# Patient Record
Sex: Male | Born: 1955 | Race: Black or African American | Hispanic: No | Smoking: Current every day smoker
Health system: Southern US, Community
[De-identification: ages and names within clinical notes are randomized; demographics above are authoritative.]

## PROBLEM LIST (undated history)

## (undated) DIAGNOSIS — I639 Cerebral infarction, unspecified: Secondary | ICD-10-CM

## (undated) DIAGNOSIS — M5416 Radiculopathy, lumbar region: Secondary | ICD-10-CM

## (undated) DIAGNOSIS — G8929 Other chronic pain: Secondary | ICD-10-CM

## (undated) DIAGNOSIS — M549 Dorsalgia, unspecified: Secondary | ICD-10-CM

## (undated) DIAGNOSIS — I10 Essential (primary) hypertension: Secondary | ICD-10-CM

## (undated) HISTORY — PX: HERNIA REPAIR: SHX51

---

## 1993-12-08 HISTORY — PX: HERNIA REPAIR: SHX51

## 2002-02-18 ENCOUNTER — Ambulatory Visit (HOSPITAL_COMMUNITY): Admission: RE | Admit: 2002-02-18 | Discharge: 2002-02-18 | Payer: Self-pay | Admitting: Family Medicine

## 2002-02-18 ENCOUNTER — Encounter: Payer: Self-pay | Admitting: Family Medicine

## 2002-02-23 ENCOUNTER — Ambulatory Visit (HOSPITAL_COMMUNITY): Admission: RE | Admit: 2002-02-23 | Discharge: 2002-02-23 | Payer: Self-pay | Admitting: Family Medicine

## 2002-05-13 ENCOUNTER — Emergency Department (HOSPITAL_COMMUNITY): Admission: EM | Admit: 2002-05-13 | Discharge: 2002-05-13 | Payer: Self-pay | Admitting: Emergency Medicine

## 2006-01-22 ENCOUNTER — Emergency Department (HOSPITAL_COMMUNITY): Admission: EM | Admit: 2006-01-22 | Discharge: 2006-01-22 | Payer: Self-pay | Admitting: Emergency Medicine

## 2006-03-11 ENCOUNTER — Ambulatory Visit (HOSPITAL_COMMUNITY): Admission: RE | Admit: 2006-03-11 | Discharge: 2006-03-11 | Payer: Self-pay | Admitting: Family Medicine

## 2008-02-08 ENCOUNTER — Emergency Department (HOSPITAL_COMMUNITY): Admission: EM | Admit: 2008-02-08 | Discharge: 2008-02-08 | Payer: Self-pay | Admitting: Emergency Medicine

## 2010-01-08 ENCOUNTER — Emergency Department (HOSPITAL_COMMUNITY): Admission: EM | Admit: 2010-01-08 | Discharge: 2010-01-08 | Payer: Self-pay | Admitting: Emergency Medicine

## 2010-01-27 ENCOUNTER — Emergency Department (HOSPITAL_COMMUNITY): Admission: EM | Admit: 2010-01-27 | Discharge: 2010-01-27 | Payer: Self-pay | Admitting: Emergency Medicine

## 2010-06-11 ENCOUNTER — Ambulatory Visit (HOSPITAL_COMMUNITY): Admission: RE | Admit: 2010-06-11 | Discharge: 2010-06-11 | Payer: Self-pay | Admitting: Family Medicine

## 2011-02-08 ENCOUNTER — Emergency Department (HOSPITAL_COMMUNITY)
Admission: EM | Admit: 2011-02-08 | Discharge: 2011-02-08 | Disposition: A | Discharge status: Home / Self Care | Payer: BC Managed Care – PPO | Attending: Emergency Medicine | Admitting: Emergency Medicine

## 2011-02-08 ENCOUNTER — Emergency Department (HOSPITAL_COMMUNITY): Payer: BC Managed Care – PPO

## 2011-02-08 ENCOUNTER — Encounter: Payer: Self-pay | Admitting: Orthopedic Surgery

## 2011-02-08 DIAGNOSIS — Y929 Unspecified place or not applicable: Secondary | ICD-10-CM | POA: Insufficient documentation

## 2011-02-08 DIAGNOSIS — W2209XA Striking against other stationary object, initial encounter: Secondary | ICD-10-CM | POA: Insufficient documentation

## 2011-02-08 DIAGNOSIS — S62319A Displaced fracture of base of unspecified metacarpal bone, initial encounter for closed fracture: Secondary | ICD-10-CM | POA: Insufficient documentation

## 2011-02-10 ENCOUNTER — Ambulatory Visit (INDEPENDENT_AMBULATORY_CARE_PROVIDER_SITE_OTHER): Payer: BC Managed Care – PPO | Admitting: Orthopedic Surgery

## 2011-02-10 ENCOUNTER — Encounter: Payer: Self-pay | Admitting: Orthopedic Surgery

## 2011-02-10 DIAGNOSIS — S62319A Displaced fracture of base of unspecified metacarpal bone, initial encounter for closed fracture: Secondary | ICD-10-CM | POA: Insufficient documentation

## 2011-02-17 ENCOUNTER — Encounter: Payer: Self-pay | Admitting: Orthopedic Surgery

## 2011-02-18 NOTE — Letter (Signed)
Summary: Out of Work  [facility]  [address]  [place], [place] [address]   Phone: [phone]  Fax: [phone]    February 10, 2011   Employee:  Randy Ashley    To Whom It May Concern:   For Medical reasons, please excuse the above named employee from work for the following dates:  Start:   02/10/11 - Seen in our office for Appointment today.             Out of work X6 weeks  End:   03/25/11 (Approximate return to work)  If you need additional information, please feel free to contact our office.         Sincerely,    Terrance Mass, MD

## 2011-02-18 NOTE — Assessment & Plan Note (Signed)
Summary: AP ER FOL/UP FX LT HAND/XRAY AP 06/06/11/BCBS/[facility]F   Visit Type:  new patient Referring Provider:  ap er Primary Provider:  Dr. Sudie Bailey  CC:  left hand.  History of Present Illness:   55 years old male in no his LEFT dominant hand.  Date of injury March 1  Complaints of mild pain and discomfort swelling worsened by movement relieved by rest and mobility   DOI 02/06/11 hit a wall.  Xrays [facility] 02/08/11 left hand [facility].  Norco 5 number 20 and Ibuporfen 800mg  number 30 given from er.  he has not required any medication at this point.  Allergies (verified): No Known Drug Allergies  Past History:  Past Medical History: na  Past Surgical History: na  Family History: Family History of Diabetes Family History Coronary Heart Disease male < 23 Family History of Arthritis  Social History: Patient is single.  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 Cardiovascular:  Denies chest pain, palpitations, fainting, and murmurs. Respiratory:  Denies short of breath, wheezing, couch, tightness, pain on inspiration, and snoring . Gastrointestinal:  Denies heartburn, nausea, vomiting, diarrhea, constipation, and blood in your stools. Genitourinary:  Denies frequency, urgency, difficulty urinating, painful urination, flank pain, and bleeding in urine. Neurologic:  Denies numbness, tingling, unsteady gait, dizziness, tremors, and seizure. Musculoskeletal:  Denies joint pain, swelling, instability, stiffness, redness, heat, and muscle pain. Endocrine:  Denies excessive thirst, exessive urination, and heat or cold intolerance. Psychiatric:  Denies nervousness, depression, anxiety, and hallucinations. Skin:  Denies changes in the skin, poor healing, rash, itching, and redness. HEENT:  Denies blurred or double vision, eye pain,  redness, and watering. Immunology:  Denies seasonal allergies, sinus problems, and allergic to bee stings. Hemoatologic:  Denies easy bleeding and brusing.  Physical Exam  Cervical Nodes:  no significant adenopathy Psych:  alert and cooperative; normal mood and affect; normal attention span and concentration   Wrist/Hand Exam  General:    Well-developed, well-nourished, in no acute distress; alert and oriented x 3.    Skin:    Intact with no erythema; no scarring.    Inspection:    swelling: LEFT hand  Palpation:    tenderness at the proximal portion of the fifth metacarpal at the carpometacarpal joint LEFT hand.  Vascular:    normal pulse perfusion and capillary refill in the LEFT upper extremity  Sensory:    normal sensation LEFT upper extremity  Motor:    normal muscle tone LEFT upper extremity  Reflexes:    reflexes were deferred  Wrist Exam:    Left:    Inspection:  Normal    Palpation:  Normal    Stability:  stable  Hand Exam:    Left:    Inspection:  Abnormal    Palpation:  Abnormal    Tenderness:  5th metacarpal    Swelling:  5th metacarpal    Erythema:  no    rotational alignment was checked with the full passive extension wrist test and compared to his RIGHT hand and there is no rotatory malalignment no angular malalignment   Impression & Recommendations:  Problem # 1:  CLOSED FRACTURE OF BASE OF OTHER METACARPAL BONE (ICD-815.02) Assessment New  hospital x-rays were reviewed with the report.  There is a basilar fracture fifth metacarpal LEFT hand is minimal to no dorsal angulation.  It appears to be a Y-shaped fracture with some displacement.  I discussed this with the patient offered and cast treatment vs. surgical treatment with a plate.  He opted for the cast treatment.  He was placed in a short arm cast  X-ray in 3 weeks in cast  Total time expected 6 weeks.  Orders: New Patient Level III ([address]) Metacarpal Fx ([address])  Medications  Added to Medication List This Visit: 1)  Norco 5-325 Mg Tabs (Hydrocodone-acetaminophen) .Marland Kitchen.. 1 by mouth q 4 prn pain  Patient Instructions: 1)  CAST X 6 WEEKS  2)  XRAYS IN 3 WEEKS LEFT HAND / IN CAST  3)  LEFT HAND WEIGHT LIMIT 5 LBS OR LESS  4)  Please do not get the cast wet. It will casue a severe skin reaction. If you do get it wet, dry it with a hairdryer on a low setting and call the office. [the cast will need to be changed] Prescriptions: NORCO 5-325 MG TABS (HYDROCODONE-ACETAMINOPHEN) 1 by mouth q 4 prn pain  #42 x 1   Entered and Authorized by:   Fuller Canada MD   Signed by:   Fuller Canada MD on 02/10/2011   Method used:   Print then Give to Patient   RxID:   [id number]    Orders Added: 1)  New Patient Level III [[address]] 2)  Metacarpal Fx [[address]600]

## 2011-02-26 ENCOUNTER — Encounter: Payer: Self-pay | Admitting: Orthopedic Surgery

## 2011-03-05 ENCOUNTER — Ambulatory Visit (INDEPENDENT_AMBULATORY_CARE_PROVIDER_SITE_OTHER): Payer: BC Managed Care – PPO | Admitting: Orthopedic Surgery

## 2011-03-05 DIAGNOSIS — S62308A Unspecified fracture of other metacarpal bone, initial encounter for closed fracture: Secondary | ICD-10-CM

## 2011-03-05 DIAGNOSIS — S62309A Unspecified fracture of unspecified metacarpal bone, initial encounter for closed fracture: Secondary | ICD-10-CM

## 2011-03-05 NOTE — Discharge Summary (Signed)
Separate identifiable. X-ray report, LEFT hand. X-rays 3 views, AP, lateral, and oblique.  Standard x-rays.  Followup of 5th metacarpal fracture.  X-rays show 1/5 metacarpal fracture at the base, which is reduced in good position with appropriate alignment.  Impression healing reduced fracture, LEFT 5th metacarpal.

## 2011-03-05 NOTE — Progress Notes (Signed)
Date of injury March 1.  Short arm cast was applied.  Diagnosis basilar fracture, 5th metacarpal.  Fracture was comminuted in 3 pieces in Y. Configuration. Minimal dorsal displacement.   patient was placed in a short arm cast. Patient wants cast removed. I. Time, the cast can be removed, but it can be changed. Cast was changed.  X-ray was taken.  X-rays show fractures in a reduced position and healing well. Appropriate alignment.

## 2011-03-06 NOTE — Letter (Signed)
Summary: History form  History form   Imported By: Cammie Sickle 02/24/2011 22:03:56  _____________________________________________________________________  External Attachment:    Type:   Image     Comment:   External Document

## 2011-03-27 ENCOUNTER — Ambulatory Visit (INDEPENDENT_AMBULATORY_CARE_PROVIDER_SITE_OTHER): Payer: BC Managed Care – PPO | Admitting: Orthopedic Surgery

## 2011-03-27 ENCOUNTER — Encounter: Payer: Self-pay | Admitting: Orthopedic Surgery

## 2011-03-27 DIAGNOSIS — S62309A Unspecified fracture of unspecified metacarpal bone, initial encounter for closed fracture: Secondary | ICD-10-CM

## 2011-03-27 NOTE — Discharge Summary (Signed)
Separate x-ray report 3 views, LEFTPain  AP, lateral, and oblique fractures are seen at the base of the 5th metacarpal, which has healed in normal alignment.  Impression healed fracture, 5th metacarpal, LEFT hand

## 2011-03-27 NOTE — Progress Notes (Signed)
Fracture follow up   Injury date 3.1.2012  Dx left metacarpal base fracture   Treatment S[facility]   Today sched for x rays   Subjective: no complaints   Exam normal alignment   xrays shows fracture healing   D/C RTW

## 2011-04-01 ENCOUNTER — Ambulatory Visit: Payer: BC Managed Care – PPO | Admitting: Orthopedic Surgery

## 2011-04-25 NOTE — Procedures (Signed)
[facility]  Patient:    NGAI, PARCELL Visit Number: [id number] MRN: [id number]          Service Type: OUT Location: RAD Attending Physician:  John Giovanni Dictated by:   Kari Baars, M.D. Proc. Date: 02/23/02 Admit Date:  02/18/2002 Discharge Date: 02/18/2002                      Pulmonary Function Test Inter.  RESULTS: 1. Spirometry shows minimal ventilatory defect without definite air flow    obstruction. 2. Lung volumes show mild restrictive change and mild air trapping. 3. DLCO is normal. Dictated by:   Kari Baars, M.D. Attending Physician:  John Giovanni DD:  02/23/02 TD:  02/25/02 Job: [id number]

## 2011-09-01 LAB — POCT I-STAT, CHEM 8
Chloride: 107
Creatinine, Ser: 0.9
Glucose, Bld: 104 — ABNORMAL HIGH
HCT: 47
Hemoglobin: 16
Potassium: 3.7
Sodium: 143

## 2011-09-01 LAB — POCT CARDIAC MARKERS: CKMB, poc: 1.1

## 2011-10-07 ENCOUNTER — Encounter (HOSPITAL_COMMUNITY): Payer: Self-pay | Admitting: *Deleted

## 2011-10-07 ENCOUNTER — Emergency Department (HOSPITAL_COMMUNITY)
Admission: EM | Admit: 2011-10-07 | Discharge: 2011-10-07 | Disposition: A | Discharge status: Home / Self Care | Payer: BC Managed Care – PPO | Attending: Emergency Medicine | Admitting: Emergency Medicine

## 2011-10-07 DIAGNOSIS — F1010 Alcohol abuse, uncomplicated: Secondary | ICD-10-CM | POA: Insufficient documentation

## 2011-10-07 DIAGNOSIS — F411 Generalized anxiety disorder: Secondary | ICD-10-CM | POA: Insufficient documentation

## 2011-10-07 DIAGNOSIS — F17200 Nicotine dependence, unspecified, uncomplicated: Secondary | ICD-10-CM | POA: Insufficient documentation

## 2011-10-07 DIAGNOSIS — F419 Anxiety disorder, unspecified: Secondary | ICD-10-CM

## 2011-10-07 NOTE — ED Notes (Signed)
Discharge instructions reviewed with pt, questions answered. Pt verbalized understanding.  

## 2011-10-07 NOTE — ED Notes (Signed)
Felt nervous for 1 month.

## 2011-10-07 NOTE — ED Provider Notes (Signed)
History     CSN: [id number] Arrival date & time: 10/07/2011  3:41 AM   First MD Initiated Contact with Patient 10/07/11 [phone]      Chief Complaint  Patient presents with  . Anxiety     The history is provided by the patient.   patient reports for approximately one month he has been nervous and reports this as "nervousness on the inside".  He reports sometimes he can feel his own heartbeat in size.  He reports that the heart is not going faster than the heart rhythm is not irregular.  He denies chest pain shortness of breath.  He denies homicidal and suicidal ideation.  He does report heavy EtOH abuse and has for years.  He has no intention of wanting to quit drinking.  He does report this is likely a problem.  He also reports smoking 2 packs of cigarettes a day and also understands that this is unhealthy but he does not want to quit.  There is no prior mental health problems.  He reports a sense of uneasiness is affecting his sleep.  He has had no recent life changes.  He continues to hold a job and is having difficulty at work.  He's had no recent death in the family.  He reports finances are not a current stressor.  Nothing improves his symptoms.  Nothing worsens his symptoms.  He has otherwise no other associated symptoms.  His symptoms are described as mild in severity.  He has not doctors primary care doctor about the symptoms  History reviewed. No pertinent past medical history.  History reviewed. No pertinent past surgical history.  Family History  Problem Relation Age of Onset  . Diabetes      Family History   . Heart disease      Family History   . Arthritis      Family History     History  Substance Use Topics  . Smoking status: Current Everyday Smoker    Types: Cigarettes  . Smokeless tobacco: Not on file  . Alcohol Use: Yes     Drinks 6 pack of beer per day       Review of Systems  All other systems reviewed and are negative.    Allergies  Review of  patient's allergies indicates no known allergies.  Home Medications   Current Outpatient Rx  Name Route Sig Dispense Refill  . HYDROCODONE-ACETAMINOPHEN 5-325 MG PO TABS Oral Take 1 tablet by mouth every 4 (four) hours as needed.        BP 141/73  Pulse 77  Temp(Src) 98.6 F (37 C) (Oral)  Resp 18  Ht 5\' 7"  (1.702 m)  Wt 140 lb (63.504 kg)  BMI 21.93 kg/m2  SpO2 98%  Physical Exam  Constitutional: He is oriented to person, place, and time. He appears well-developed and well-nourished.  HENT:  Head: Normocephalic.  Eyes: EOM are normal.  Neck: Normal range of motion.  Cardiovascular: Normal rate and regular rhythm.   Pulmonary/Chest: Effort normal.  Abdominal: Soft.  Musculoskeletal: Normal range of motion.  Neurological: He is alert and oriented to person, place, and time.  Skin: Skin is warm and dry.  Psychiatric: He has a normal mood and affect.    ED Course  Procedures (including critical care time)   Date: 10/07/2011  Rate: 77  Rhythm: normal sinus rhythm  QRS Axis: normal  Intervals: normal  ST/T Wave abnormalities: normal  Conduction Disutrbances:none  Narrative Interpretation:   Old EKG  Reviewed: No significant changes noted     Labs Reviewed - No data to display No results found.   1. Anxiety       MDM  Patient is well-appearing.  He is does not appear to be a threat to himself or to others.  May represent underlying anxiety disorder for whichshe's been possibly self-medicating with alcohol.  At this time the patient does not want to stop drinking or smoking.  He's been encouraged to do both.  He is instructed to followup with his primary care physician in the next several days for additional evaluation.  Patient will likely benefit from outpatient psychiatric and psychological evaluation as well.         Lyanne Co, MD 10/07/11 [phone]

## 2015-09-11 ENCOUNTER — Emergency Department (HOSPITAL_COMMUNITY)
Admission: EM | Admit: 2015-09-11 | Discharge: 2015-09-11 | Disposition: A | Discharge status: Home / Self Care | Payer: BLUE CROSS/BLUE SHIELD | Attending: Emergency Medicine | Admitting: Emergency Medicine

## 2015-09-11 ENCOUNTER — Emergency Department (HOSPITAL_COMMUNITY): Payer: BLUE CROSS/BLUE SHIELD

## 2015-09-11 ENCOUNTER — Encounter (HOSPITAL_COMMUNITY): Payer: Self-pay | Admitting: Emergency Medicine

## 2015-09-11 DIAGNOSIS — G8929 Other chronic pain: Secondary | ICD-10-CM | POA: Insufficient documentation

## 2015-09-11 DIAGNOSIS — R202 Paresthesia of skin: Secondary | ICD-10-CM | POA: Diagnosis not present

## 2015-09-11 DIAGNOSIS — R1030 Lower abdominal pain, unspecified: Secondary | ICD-10-CM | POA: Diagnosis present

## 2015-09-11 DIAGNOSIS — I10 Essential (primary) hypertension: Secondary | ICD-10-CM | POA: Insufficient documentation

## 2015-09-11 DIAGNOSIS — Z720 Tobacco use: Secondary | ICD-10-CM | POA: Insufficient documentation

## 2015-09-11 DIAGNOSIS — Z8739 Personal history of other diseases of the musculoskeletal system and connective tissue: Secondary | ICD-10-CM | POA: Diagnosis not present

## 2015-09-11 HISTORY — DX: Other chronic pain: G89.29

## 2015-09-11 HISTORY — DX: Radiculopathy, lumbar region: M54.16

## 2015-09-11 HISTORY — DX: Dorsalgia, unspecified: M54.9

## 2015-09-11 HISTORY — DX: Essential (primary) hypertension: I10

## 2015-09-11 LAB — CBC WITH DIFFERENTIAL/PLATELET
BASOS ABS: 0 10*3/uL (ref 0.0–0.1)
BASOS PCT: 0 %
EOS PCT: 5 %
Eosinophils Absolute: 0.3 10*3/uL (ref 0.0–0.7)
HCT: 41.6 % (ref 39.0–52.0)
Hemoglobin: 14.1 g/dL (ref 13.0–17.0)
LYMPHS PCT: 39 %
Lymphs Abs: 2.2 10*3/uL (ref 0.7–4.0)
MCH: 32.4 pg (ref 26.0–34.0)
MCHC: 33.9 g/dL (ref 30.0–36.0)
MCV: 95.6 fL (ref 78.0–100.0)
Monocytes Absolute: 0.4 10*3/uL (ref 0.1–1.0)
Monocytes Relative: 8 %
Neutro Abs: 2.7 10*3/uL (ref 1.7–7.7)
Neutrophils Relative %: 48 %
PLATELETS: 266 10*3/uL (ref 150–400)
RBC: 4.35 MIL/uL (ref 4.22–5.81)
RDW: 13.7 % (ref 11.5–15.5)
WBC: 5.5 10*3/uL (ref 4.0–10.5)

## 2015-09-11 LAB — COMPREHENSIVE METABOLIC PANEL
ALBUMIN: 3.7 g/dL (ref 3.5–5.0)
ALT: 29 U/L (ref 17–63)
AST: 34 U/L (ref 15–41)
Alkaline Phosphatase: 57 U/L (ref 38–126)
Anion gap: 10 (ref 5–15)
BUN: 19 mg/dL (ref 6–20)
CHLORIDE: 108 mmol/L (ref 101–111)
CO2: 22 mmol/L (ref 22–32)
CREATININE: 0.81 mg/dL (ref 0.61–1.24)
Calcium: 8.4 mg/dL — ABNORMAL LOW (ref 8.9–10.3)
GFR calc Af Amer: >60 mL/min (ref >60)
GLUCOSE: 109 mg/dL — AB (ref 65–99)
POTASSIUM: 4.1 mmol/L (ref 3.5–5.1)
SODIUM: 140 mmol/L (ref 135–145)
Total Bilirubin: 0.2 mg/dL — ABNORMAL LOW (ref 0.3–1.2)
Total Protein: 6.9 g/dL (ref 6.5–8.1)

## 2015-09-11 LAB — URINALYSIS, ROUTINE W REFLEX MICROSCOPIC
Bilirubin Urine: NEGATIVE
Glucose, UA: NEGATIVE mg/dL
HGB URINE DIPSTICK: NEGATIVE
KETONES UR: NEGATIVE mg/dL
LEUKOCYTES UA: NEGATIVE
NITRITE: NEGATIVE
PROTEIN: NEGATIVE mg/dL
Specific Gravity, Urine: 1.025 (ref 1.005–1.030)
Urobilinogen, UA: 0.2 mg/dL (ref 0.0–1.0)
pH: 6 (ref 5.0–8.0)

## 2015-09-11 LAB — SEDIMENTATION RATE: Sed Rate: 2 mm/hr (ref 0–16)

## 2015-09-11 LAB — C-REACTIVE PROTEIN: CRP: <0.5 mg/dL (ref <1.0)

## 2015-09-11 LAB — CK: Total CK: 135 U/L (ref 49–397)

## 2015-09-11 MED ORDER — ALPRAZOLAM 0.5 MG PO TABS
0.5000 mg | ORAL_TABLET | Freq: Once | ORAL | Status: DC
  Filled 2015-09-11: qty 1

## 2015-09-11 NOTE — ED Notes (Signed)
Pt c/o of groin pain that radiates to LT/RT buttock. Pt describes it as a "burning sensation." Pt denies urinary symptoms. Denies n/v/d.

## 2015-09-11 NOTE — ED Provider Notes (Signed)
CSN: [id number]     Arrival date & time 09/11/15  1515 History   First MD Initiated Contact with Patient 09/11/15 1555     Chief Complaint  Patient presents with  . Groin Pain     (Consider location/radiation/quality/duration/timing/severity/associated sxs/prior Treatment) HPI  59 year old male without a significant past medical history presents to the emergency department today with 1 month of progressively worsening pain and perirectal burning and radiation down to the perineal region that also burning in nature. No weakness or sensation changes otherwise. No urinary symptoms, penile discharge, scrotal pain, diarrhea, constipation, nausea, vomiting, fever, chills or other related symptoms. Has not had this before. Feels like he may have lost some weight during this time but is unsure.  Past Medical History  Diagnosis Date  . Hypertension   . Chronic back pain   . Lumbar radiculopathy    History reviewed. No pertinent past surgical history. Family History  Problem Relation Age of Onset  . Diabetes      Family History   . Heart disease      Family History   . Arthritis      Family History    Social History  Substance Use Topics  . Smoking status: Current Every Day Smoker -- 1.00 packs/day    Types: Cigarettes  . Smokeless tobacco: None  . Alcohol Use: Yes     Comment: Drinks 6 pack of beer per day     Review of Systems  All other systems reviewed and are negative.     Allergies  Review of patient's allergies indicates no known allergies.  Home Medications   Prior to Admission medications   Medication Sig Start Date End Date Taking? Authorizing Provider  famciclovir (FAMVIR) 500 MG tablet Take 500 mg by mouth 2 (two) times daily as needed. For Herpes virus infection 07/13/15  Yes Historical Provider, MD   BP 128/79 mmHg  Pulse 98  Temp(Src) 98.2 F (36.8 C) (Oral)  Resp 16  Ht  (1.702 m)  Wt 140 lb (63.504 kg)  BMI 21.92 kg/m2  SpO2 100% Physical Exam   Constitutional: He is oriented to person, place, and time. He appears well-developed and well-nourished.  HENT:  Head: Normocephalic and atraumatic.  Eyes: Conjunctivae and EOM are normal.  Neck: Normal range of motion. Neck supple.  Cardiovascular: Normal rate and regular rhythm.   Pulmonary/Chest: Effort normal. No respiratory distress.  Abdominal: Soft. There is no tenderness.  Genitourinary: Rectal exam shows no external hemorrhoid, no fissure, no mass and anal tone normal. Guaiac negative stool. Prostate is tender. Prostate is not enlarged.  Musculoskeletal: Normal range of motion. He exhibits no edema or tenderness.  Neurological: He is alert and oriented to person, place, and time.  Skin: Skin is warm and dry. No rash noted.  Nursing note and vitals reviewed.   ED Course  Procedures (including critical care time) Labs Review Labs Reviewed  COMPREHENSIVE METABOLIC PANEL - Abnormal; Notable for the following:    Glucose, Bld 109 (*)    Calcium 8.4 (*)    Total Bilirubin 0.2 (*)    All other components within normal limits  URINALYSIS, ROUTINE W REFLEX MICROSCOPIC (NOT AT [facility])  CBC WITH DIFFERENTIAL/PLATELET  SEDIMENTATION RATE  C-REACTIVE PROTEIN  CK    Imaging Review No results found. I have personally reviewed and evaluated these images and lab results as part of my medical decision-making.   EKG Interpretation None      MDM   Final diagnoses:  Paresthesia   Unsure of the cause of the patient's symptoms. I will work him up for any type of arterial injury or clot, also will evaluate for prostatitis. Ultrasound negative for any aortic or iliac problems. Patient not wanting MRI right now.  Urine clear, doubt prostatitis. Unsure of cause of symptoms but will continue to follow up with PCP for further workup.  I have personally and contemperaneously reviewed labs and imaging and used in my decision making as above.   A medical screening exam was performed and I  feel the patient has had an appropriate workup for their chief complaint at this time and likelihood of emergent condition existing is low. They have been counseled on decision, discharge, follow up and which symptoms necessitate immediate return to the emergency department. They or their family verbally stated understanding and agreement with plan and discharged in stable condition.      Marily Memos, MD 09/12/15 [phone]

## 2015-09-11 NOTE — ED Notes (Signed)
Pt states that he has to leave for work and refuses MRI

## 2015-09-12 LAB — POC OCCULT BLOOD, ED: FECAL OCCULT BLD: NEGATIVE

## 2016-06-23 ENCOUNTER — Emergency Department (HOSPITAL_COMMUNITY): Payer: BLUE CROSS/BLUE SHIELD

## 2016-06-23 ENCOUNTER — Encounter (HOSPITAL_COMMUNITY): Payer: Self-pay | Admitting: Emergency Medicine

## 2016-06-23 ENCOUNTER — Emergency Department (HOSPITAL_COMMUNITY)
Admission: EM | Admit: 2016-06-23 | Discharge: 2016-06-23 | Disposition: A | Discharge status: Home / Self Care | Payer: BLUE CROSS/BLUE SHIELD | Attending: Emergency Medicine | Admitting: Emergency Medicine

## 2016-06-23 DIAGNOSIS — R109 Unspecified abdominal pain: Secondary | ICD-10-CM | POA: Diagnosis present

## 2016-06-23 DIAGNOSIS — I10 Essential (primary) hypertension: Secondary | ICD-10-CM | POA: Diagnosis not present

## 2016-06-23 DIAGNOSIS — F17210 Nicotine dependence, cigarettes, uncomplicated: Secondary | ICD-10-CM | POA: Insufficient documentation

## 2016-06-23 LAB — CBC
HCT: 45.9 % (ref 39.0–52.0)
Hemoglobin: 15.8 g/dL (ref 13.0–17.0)
MCH: 33 pg (ref 26.0–34.0)
MCHC: 34.4 g/dL (ref 30.0–36.0)
MCV: 95.8 fL (ref 78.0–100.0)
PLATELETS: 273 10*3/uL (ref 150–400)
RBC: 4.79 MIL/uL (ref 4.22–5.81)
RDW: 14.5 % (ref 11.5–15.5)
WBC: 12.6 10*3/uL — AB (ref 4.0–10.5)

## 2016-06-23 LAB — URINALYSIS, ROUTINE W REFLEX MICROSCOPIC
GLUCOSE, UA: NEGATIVE mg/dL
KETONES UR: 15 mg/dL — AB
Leukocytes, UA: NEGATIVE
Nitrite: NEGATIVE
PH: 5.5 (ref 5.0–8.0)
Specific Gravity, Urine: 1.02 (ref 1.005–1.030)

## 2016-06-23 LAB — COMPREHENSIVE METABOLIC PANEL
ALT: 27 U/L (ref 17–63)
AST: 39 U/L (ref 15–41)
Albumin: 4.4 g/dL (ref 3.5–5.0)
Alkaline Phosphatase: 63 U/L (ref 38–126)
Anion gap: 8 (ref 5–15)
BUN: 17 mg/dL (ref 6–20)
CHLORIDE: 106 mmol/L (ref 101–111)
CO2: 23 mmol/L (ref 22–32)
CREATININE: 0.86 mg/dL (ref 0.61–1.24)
Calcium: 9.2 mg/dL (ref 8.9–10.3)
GFR calc Af Amer: >60 mL/min (ref >60)
GLUCOSE: 82 mg/dL (ref 65–99)
Potassium: 4.2 mmol/L (ref 3.5–5.1)
SODIUM: 137 mmol/L (ref 135–145)
Total Bilirubin: 0.6 mg/dL (ref 0.3–1.2)
Total Protein: 7.7 g/dL (ref 6.5–8.1)

## 2016-06-23 LAB — URINE MICROSCOPIC-ADD ON

## 2016-06-23 LAB — LIPASE, BLOOD: Lipase: 35 U/L (ref 11–51)

## 2016-06-23 MED ORDER — DICYCLOMINE HCL 20 MG PO TABS: 20.0000 mg | ORAL_TABLET | Freq: Two times a day (BID) | ORAL | Status: DC | PRN

## 2016-06-23 NOTE — Discharge Instructions (Signed)

## 2016-06-23 NOTE — ED Provider Notes (Signed)
CSN: [id number]     Arrival date & time 06/23/16  1321 History   First MD Initiated Contact with Patient 06/23/16 1534     Chief Complaint  Patient presents with  . Abdominal Pain     (Consider location/radiation/quality/duration/timing/severity/associated sxs/prior Treatment) HPI Pt presents with intermittent abdominal cramps starting around 10 am today. Describes the pain as sharp and generalized. Currently has no pain. Denies associated nausea, vomiting, diarrhea. Last BM was this morning and was normal. No fever or chills. No urinary symptoms. No radiation of pain.  Past Medical History  Diagnosis Date  . Hypertension   . Chronic back pain   . Lumbar radiculopathy    Past Surgical History  Procedure Laterality Date  . Hernia repair     Family History  Problem Relation Age of Onset  . Diabetes      Family History   . Heart disease      Family History   . Arthritis      Family History    Social History  Substance Use Topics  . Smoking status: Current Every Day Smoker -- 1.00 packs/day    Types: Cigarettes  . Smokeless tobacco: None  . Alcohol Use: Yes     Comment: Drinks 6 pack of beer per day     Review of Systems  Constitutional: Negative for fever and chills.  Respiratory: Negative for cough and shortness of breath.   Cardiovascular: Negative for chest pain, palpitations and leg swelling.  Gastrointestinal: Positive for abdominal pain. Negative for nausea, vomiting, diarrhea, constipation, blood in stool and abdominal distention.  Genitourinary: Negative for dysuria, frequency, hematuria and flank pain.  Musculoskeletal: Negative for myalgias, back pain, neck pain and neck stiffness.  Skin: Negative for rash and wound.  Neurological: Negative for dizziness, syncope, weakness, light-headedness, numbness and headaches.  All other systems reviewed and are negative.     Allergies  Review of patient's allergies indicates no known allergies.  Home Medications    Prior to Admission medications   Medication Sig Start Date End Date Taking? Authorizing Provider  dicyclomine (BENTYL) 20 MG tablet Take 1 tablet (20 mg total) by mouth 2 (two) times daily as needed for spasms. 06/23/16   Loren Raceravid Aubrynn Katona, MD   BP 130/69 mmHg  Pulse 76  Temp(Src) 98.5 F (36.9 C) (Oral)  Resp 18  Ht 5\' 7"  (1.702 m)  Wt 138 lb (62.596 kg)  BMI 21.61 kg/m2  SpO2 100% Physical Exam  Constitutional: He is oriented to person, place, and time. He appears well-developed and well-nourished. No distress.  HENT:  Head: Normocephalic and atraumatic.  Mouth/Throat: Oropharynx is clear and moist.  Eyes: EOM are normal. Pupils are equal, round, and reactive to light.  Neck: Normal range of motion. Neck supple.  Cardiovascular: Normal rate and regular rhythm.   Pulmonary/Chest: Effort normal and breath sounds normal. No respiratory distress. He has no wheezes. He has no rales. He exhibits no tenderness.  Abdominal: Soft. Bowel sounds are normal. He exhibits no distension and no mass. There is no tenderness. There is no rebound and no guarding.  Musculoskeletal: Normal range of motion. He exhibits no edema or tenderness.  No CVA tenderness  Neurological: He is alert and oriented to person, place, and time.  Moves all ext w/o deficit. Sensation intact  Skin: Skin is warm and dry. No rash noted. No erythema.  Psychiatric: He has a normal mood and affect. His behavior is normal.  Nursing note and vitals reviewed.   ED  Course  Procedures (including critical care time) Labs Review Labs Reviewed  CBC - Abnormal; Notable for the following:    WBC 12.6 (*)    All other components within normal limits  URINALYSIS, ROUTINE W REFLEX MICROSCOPIC (NOT AT [facility]) - Abnormal; Notable for the following:    Hgb urine dipstick TRACE (*)    Bilirubin Urine SMALL (*)    Ketones, ur 15 (*)    Protein, ur TRACE (*)    All other components within normal limits  URINE MICROSCOPIC-ADD ON -  Abnormal; Notable for the following:    Squamous Epithelial / LPF 0-5 (*)    Bacteria, UA RARE (*)    Casts HYALINE CASTS (*)    All other components within normal limits  LIPASE, BLOOD  COMPREHENSIVE METABOLIC PANEL    Imaging Review Dg Abd 1 View  06/23/2016  CLINICAL DATA:  Abdominal pain today. EXAM: ABDOMEN - 1 VIEW COMPARISON:  None. FINDINGS: The bowel gas pattern is unremarkable. No findings for obstruction or perforation. The soft tissue shadows are maintained. The bony structures are unremarkable. IMPRESSION: No plain film findings for an acute abdominal process. Electronically Signed   By: Rudie Meyer M.D.   On: 06/23/2016 16:44   I have personally reviewed and evaluated these images and lab results as part of my medical decision-making.   EKG Interpretation None      MDM   Final diagnoses:  Abdominal cramping    Benign abdominal exam. Currently pain-free.  Mild elevation in white blood cell count of uncertain significance. Patient needs to be well-appearing. No pain. Benign abdominal exam. Likely bowel spasms. Advised to follow-up with his primary physician. Return precautions have been given.  Loren Racer, MD 06/23/16 [phone]

## 2016-06-23 NOTE — ED Notes (Signed)
Patient complaining of abdominal pain starting today. Denies vomiting, diarrhea.

## 2018-02-05 ENCOUNTER — Encounter (HOSPITAL_COMMUNITY): Payer: Self-pay

## 2018-02-05 ENCOUNTER — Emergency Department (HOSPITAL_COMMUNITY)
Admission: EM | Admit: 2018-02-05 | Discharge: 2018-02-05 | Disposition: A | Discharge status: Home / Self Care | Payer: Self-pay | Attending: Emergency Medicine | Admitting: Emergency Medicine

## 2018-02-05 ENCOUNTER — Emergency Department (HOSPITAL_COMMUNITY): Payer: Self-pay

## 2018-02-05 DIAGNOSIS — I10 Essential (primary) hypertension: Secondary | ICD-10-CM | POA: Insufficient documentation

## 2018-02-05 DIAGNOSIS — M79671 Pain in right foot: Secondary | ICD-10-CM | POA: Insufficient documentation

## 2018-02-05 DIAGNOSIS — F17210 Nicotine dependence, cigarettes, uncomplicated: Secondary | ICD-10-CM | POA: Insufficient documentation

## 2018-02-05 DIAGNOSIS — Z79899 Other long term (current) drug therapy: Secondary | ICD-10-CM | POA: Insufficient documentation

## 2018-02-05 DIAGNOSIS — M79672 Pain in left foot: Secondary | ICD-10-CM | POA: Insufficient documentation

## 2018-02-05 MED ORDER — IBUPROFEN 400 MG PO TABS
400.0000 mg | ORAL_TABLET | Freq: Once | ORAL | Status: AC
  Administered 2018-02-05: 400 mg via ORAL

## 2018-02-05 MED ORDER — IBUPROFEN 400 MG PO TABS: 400.0000 mg | ORAL_TABLET | Freq: Three times a day (TID) | ORAL | 0 refills | Status: DC | PRN

## 2018-02-05 MED ORDER — IBUPROFEN 400 MG PO TABS
ORAL_TABLET | ORAL | Status: AC
  Filled 2018-02-05: qty 1

## 2018-02-05 NOTE — ED Provider Notes (Signed)
[facility] EMERGENCY DEPARTMENT Provider Note   CSN: [id number] Arrival date & time: 02/05/18  0455     History   Chief Complaint Chief Complaint  Patient presents with  . Foot Pain    Bilateral    HPI Randy Randy is a 62 y.o. male.  The history is provided by the patient.  Foot Pain  This is a chronic problem. The current episode started more than 1 week ago. The problem occurs daily. The problem has been gradually worsening. Pertinent negatives include no chest pain and no shortness of breath. The symptoms are aggravated by walking. The symptoms are relieved by rest.   Patient reports bilateral ankle pain for the past several months.  He reports it generally occurs when he is ambulating, improved with rest.  There is no calf pain reported.  There is no numbness or weakness reported there is no recent trauma reported he reports at rest he has no pain  No trauma reported Past Medical History:  Diagnosis Date  . Chronic back pain   . Hypertension   . Lumbar radiculopathy     Patient Active Problem List   Diagnosis Date Noted  . CLOSED FRACTURE OF BASE OF OTHER METACARPAL BONE 02/10/2011    Past Surgical History:  Procedure Laterality Date  . HERNIA REPAIR         Home Medications    Prior to Admission medications   Medication Sig Start Date End Date Taking? Authorizing Provider  dicyclomine (BENTYL) 20 MG tablet Take 1 tablet (20 mg total) by mouth 2 (two) times daily as needed for spasms. 06/23/16   Loren Racer, MD    Family History Family History  Problem Relation Age of Onset  . Diabetes Unknown        Family History   . Heart disease Unknown        Family History   . Arthritis Unknown        Family History     Social History Social History   Tobacco Use  . Smoking status: Current Every Day Smoker    Packs/day: 1.00    Types: Cigarettes  . Smokeless tobacco: Never Used  Substance Use Topics  . Alcohol use: Yes    Comment: Drinks 6  pack of beer per day   . Drug use: No     Allergies   Patient has no known allergies.   Review of Systems Review of Systems  Respiratory: Negative for shortness of breath.   Cardiovascular: Negative for chest pain.  Musculoskeletal: Positive for arthralgias.     Physical Exam Updated Vital Signs BP (!) 181/86 (BP Location: Left Arm)   Pulse 89   Temp 98 F (36.7 C) (Oral)   Resp 18   Ht 1.676 m (5\' 6" )   Wt 63.5 kg (140 lb)   SpO2 98%   BMI 22.60 kg/m   Physical Exam CONSTITUTIONAL: Well developed/well nourished HEAD: Normocephalic/atraumatic EYES: EOMI ENMT: Mucous membranes moist NECK: supple no meningeal signs CV: S1/S2 noted LUNGS: Lungs are clear to auscultation bilaterally, no apparent distress ABDOMEN: soft NEURO: Pt is awake/alert/appropriate, moves all extremitiesx4.  No facial droop.   EXTREMITIES: pulses normal/equal, full ROM Bilateral calves nontender, no edema, no erythema.  Distal pulses equal and intact in both feet.  No erythema or crepitus to either foot or ankle.  No lacerations.  No signs of trauma.  There is no focal tenderness noted.  He points to the dorsal surface of the bilateral ankles  as source of pain when he ambulates SKIN: warm, color normal PSYCH: no abnormalities of mood noted, alert and oriented to situation   ED Treatments / Results  Labs (all labs ordered are listed, but only abnormal results are displayed) Labs Reviewed - No data to display  EKG  EKG Interpretation None       Radiology Dg Foot Complete Right  Result Date: 02/05/2018 CLINICAL DATA:  62 year old male with right foot pain. EXAM: RIGHT FOOT COMPLETE - 3+ VIEW COMPARISON:  None. FINDINGS: There is no acute fracture or dislocation. The bones are osteopenic. No significant arthritic changes. Mild diffuse subcutaneous edema. No soft tissue gas. IMPRESSION: No acute fracture or dislocation. Electronically Signed   By: Elgie CollardArash  Radparvar M.D.   On: 02/05/2018 06:03      Procedures Procedures (including critical care time)  Medications Ordered in ED Medications - No data to display   Initial Impression / Assessment and Plan / ED Course  I have reviewed the triage vital signs and the nursing notes.  Pertinent  imaging results that were available during my care of the patient were reviewed by me and considered in my medical decision making (see chart for details).     Bilateral foot and ankle pain for months, worsening the past several days.  He reports most of it is in his right ankle.  He now reports that it seems to be worse after working for several hours and going to bed, he will wake up and has difficulty flexing and extending his foot  he walks in the emergency department does have some limitations with dorsiflexion.  There is no deformities.  There is no signs of trauma.  His x-rays negative.  No signs of cellulitis.  No signs of septic joint.  No signs of DVT.  Neurovascularly intact.  Will start on anti-inflammatories, and refer to podiatry Final Clinical Impressions(s) / ED Diagnoses   Final diagnoses:  Foot pain, right    ED Discharge Orders        Ordered    ibuprofen (ADVIL,MOTRIN) 400 MG tablet  Every 8 hours PRN,   Status:  Discontinued     02/05/18 0620    ibuprofen (ADVIL,MOTRIN) 400 MG tablet  Every 8 hours PRN     02/05/18 [id number]       Zadie RhineWickline, Conlin Brahm, MD 02/05/18 [phone]

## 2018-02-05 NOTE — ED Triage Notes (Signed)
Bilateral foot and lower leg pain x 2 months, denies injury, denies taking any otc for same.

## 2019-02-22 ENCOUNTER — Emergency Department (HOSPITAL_COMMUNITY): Payer: Self-pay

## 2019-02-22 ENCOUNTER — Encounter (HOSPITAL_COMMUNITY): Payer: Self-pay | Admitting: Emergency Medicine

## 2019-02-22 ENCOUNTER — Emergency Department (HOSPITAL_COMMUNITY)
Admission: EM | Admit: 2019-02-22 | Discharge: 2019-02-22 | Disposition: A | Discharge status: Home / Self Care | Payer: Self-pay | Attending: Emergency Medicine | Admitting: Emergency Medicine

## 2019-02-22 ENCOUNTER — Other Ambulatory Visit: Payer: Self-pay

## 2019-02-22 DIAGNOSIS — I10 Essential (primary) hypertension: Secondary | ICD-10-CM | POA: Insufficient documentation

## 2019-02-22 DIAGNOSIS — Z79899 Other long term (current) drug therapy: Secondary | ICD-10-CM | POA: Insufficient documentation

## 2019-02-22 DIAGNOSIS — F1020 Alcohol dependence, uncomplicated: Secondary | ICD-10-CM | POA: Insufficient documentation

## 2019-02-22 DIAGNOSIS — F17210 Nicotine dependence, cigarettes, uncomplicated: Secondary | ICD-10-CM | POA: Insufficient documentation

## 2019-02-22 DIAGNOSIS — R1084 Generalized abdominal pain: Secondary | ICD-10-CM | POA: Insufficient documentation

## 2019-02-22 LAB — CBC WITH DIFFERENTIAL/PLATELET
Abs Immature Granulocytes: 0.01 10*3/uL (ref 0.00–0.07)
Basophils Absolute: 0 10*3/uL (ref 0.0–0.1)
Basophils Relative: 1 %
Eosinophils Absolute: 0.3 10*3/uL (ref 0.0–0.5)
Eosinophils Relative: 4 %
HCT: 45.7 % (ref 39.0–52.0)
Hemoglobin: 15.4 g/dL (ref 13.0–17.0)
Immature Granulocytes: 0 %
Lymphocytes Relative: 33 %
Lymphs Abs: 2 10*3/uL (ref 0.7–4.0)
MCH: 33.3 pg (ref 26.0–34.0)
MCHC: 33.7 g/dL (ref 30.0–36.0)
MCV: 98.9 fL (ref 80.0–100.0)
Monocytes Absolute: 0.7 10*3/uL (ref 0.1–1.0)
Monocytes Relative: 11 %
Neutro Abs: 3.2 10*3/uL (ref 1.7–7.7)
Neutrophils Relative %: 51 %
Platelets: 197 10*3/uL (ref 150–400)
RBC: 4.62 MIL/uL (ref 4.22–5.81)
RDW: 13.2 % (ref 11.5–15.5)
WBC: 6.1 10*3/uL (ref 4.0–10.5)
nRBC: 0 % (ref 0.0–0.2)

## 2019-02-22 LAB — PROTIME-INR
INR: 0.9 (ref 0.8–1.2)
Prothrombin Time: 12.2 seconds (ref 11.4–15.2)

## 2019-02-22 LAB — URINALYSIS, ROUTINE W REFLEX MICROSCOPIC
BILIRUBIN URINE: NEGATIVE
Glucose, UA: NEGATIVE mg/dL
HGB URINE DIPSTICK: NEGATIVE
Ketones, ur: NEGATIVE mg/dL
Leukocytes,Ua: NEGATIVE
Nitrite: NEGATIVE
Protein, ur: NEGATIVE mg/dL
SPECIFIC GRAVITY, URINE: 1.013 (ref 1.005–1.030)
pH: 5 (ref 5.0–8.0)

## 2019-02-22 LAB — COMPREHENSIVE METABOLIC PANEL WITH GFR
ALT: 291 U/L — ABNORMAL HIGH (ref 0–44)
AST: 258 U/L — ABNORMAL HIGH (ref 15–41)
Albumin: 3.3 g/dL — ABNORMAL LOW (ref 3.5–5.0)
Alkaline Phosphatase: 97 U/L (ref 38–126)
Anion gap: 9 (ref 5–15)
BUN: 15 mg/dL (ref 8–23)
CO2: 24 mmol/L (ref 22–32)
Calcium: 9 mg/dL (ref 8.9–10.3)
Chloride: 106 mmol/L (ref 98–111)
Creatinine, Ser: 0.98 mg/dL (ref 0.61–1.24)
GFR calc Af Amer: >60 mL/min
GFR calc non Af Amer: >60 mL/min
Glucose, Bld: 110 mg/dL — ABNORMAL HIGH (ref 70–99)
Potassium: 3.9 mmol/L (ref 3.5–5.1)
Sodium: 139 mmol/L (ref 135–145)
Total Bilirubin: 0.5 mg/dL (ref 0.3–1.2)
Total Protein: 7.2 g/dL (ref 6.5–8.1)

## 2019-02-22 LAB — LIPASE, BLOOD: Lipase: 47 U/L (ref 11–51)

## 2019-02-22 MED ORDER — IOHEXOL 300 MG/ML  SOLN
100.0000 mL | Freq: Once | INTRAMUSCULAR | Status: AC | PRN
  Administered 2019-02-22: 100 mL via INTRAVENOUS

## 2019-02-22 NOTE — ED Provider Notes (Signed)
[name] EMERGENCY DEPARTMENT Provider Note   CSN: [id number] Arrival date & time: 02/22/19  1231    History   Chief Complaint Chief Complaint  Patient presents with  . Abdominal Pain    HPI Randy Ashley is a 63 y.o. male.  He is presenting to the emergency department complaining of loud churning sounds in his abdomen that is been going on for about a month.  Patient states they are constant and do not seem to change even if he eats.  It is not associated with nausea vomiting diarrhea or constipation.  No blood from above or below.  No fevers or chills.  He has tried nothing for it.  He does not really call it pain but he says he may be just feels a little sore everywhere.  More to the left lower quadrant than anywhere else.  He drinks daily and he is worried that his chronic drinking is causing this problem.     The history is provided by the patient.  Abdominal Pain  Pain location:  Generalized Pain quality comment:  Soreness Pain radiates to:  Does not radiate Pain severity:  Mild Onset quality:  Gradual Timing:  Constant Progression:  Unchanged Chronicity:  New Context: alcohol use   Context: not eating, not recent illness, not recent travel, not retching, not sick contacts, not suspicious food intake and not trauma   Relieved by:  None tried Worsened by:  Nothing Ineffective treatments:  Eating Associated symptoms: no anorexia, no chest pain, no chills, no constipation, no cough, no diarrhea, no dysuria, no fever, no hematemesis, no hematochezia, no hematuria, no melena, no nausea, no shortness of breath, no sore throat and no vomiting   Risk factors: alcohol abuse     Past Medical History:  Diagnosis Date  . Chronic back pain   . Hypertension   . Lumbar radiculopathy     Patient Active Problem List   Diagnosis Date Noted  . CLOSED FRACTURE OF BASE OF OTHER METACARPAL BONE 02/10/2011    Past Surgical History:  Procedure Laterality Date  . HERNIA REPAIR           Home Medications    Prior to Admission medications   Medication Sig Start Date End Date Taking? Authorizing Provider  dicyclomine (BENTYL) 20 MG tablet Take 1 tablet (20 mg total) by mouth 2 (two) times daily as needed for spasms. 06/23/16   Loren Racer, MD  ibuprofen (ADVIL,MOTRIN) 400 MG tablet Take 1 tablet (400 mg total) by mouth every 8 (eight) hours as needed. 02/05/18   Zadie Rhine, MD    Family History Family History  Problem Relation Age of Onset  . Diabetes Other        Family History   . Heart disease Other        Family History   . Arthritis Other        Family History     Social History Social History   Tobacco Use  . Smoking status: Current Every Day Smoker    Packs/day: 1.00    Types: Cigarettes  . Smokeless tobacco: Never Used  Substance Use Topics  . Alcohol use: Yes    Comment: Drinks 6 pack of beer per day   . Drug use: No     Allergies   Patient has no known allergies.   Review of Systems Review of Systems  Constitutional: Negative for chills and fever.  HENT: Negative for sore throat.   Eyes: Negative for visual disturbance.  Respiratory: Negative for cough and shortness of breath.   Cardiovascular: Negative for chest pain.  Gastrointestinal: Positive for abdominal pain. Negative for anorexia, constipation, diarrhea, hematemesis, hematochezia, melena, nausea and vomiting.  Genitourinary: Negative for dysuria and hematuria.  Musculoskeletal: Negative for neck pain.  Skin: Negative for rash.  Neurological: Negative for syncope.     Physical Exam Updated Vital Signs BP (!) 155/82 (BP Location: Right Arm)   Pulse 71   Temp 97.9 F (36.6 C) (Oral)   Resp 16   Ht 5\' 6"  (1.676 m)   Wt 63.5 kg   SpO2 100%   BMI 22.60 kg/m   Physical Exam Vitals signs and nursing note reviewed.  Constitutional:      Appearance: He is well-developed.  HENT:     Head: Normocephalic and atraumatic.  Eyes:     Conjunctiva/sclera:  Conjunctivae normal.  Neck:     Musculoskeletal: Neck supple.  Cardiovascular:     Rate and Rhythm: Normal rate and regular rhythm.     Pulses: Normal pulses.     Heart sounds: No murmur.  Pulmonary:     Effort: Pulmonary effort is normal. No respiratory distress.     Breath sounds: Normal breath sounds.  Abdominal:     General: There is no distension.     Palpations: Abdomen is soft. There is no mass.     Tenderness: There is no abdominal tenderness. There is no guarding.     Hernia: No hernia is present.  Musculoskeletal: Normal range of motion.     Right lower leg: No edema.     Left lower leg: No edema.  Skin:    General: Skin is warm and dry.     Capillary Refill: Capillary refill takes less than 2 seconds.  Neurological:     General: No focal deficit present.     Mental Status: He is alert and oriented to person, place, and time.     Gait: Gait normal.      ED Treatments / Results  Labs (all labs ordered are listed, but only abnormal results are displayed) Labs Reviewed  COMPREHENSIVE METABOLIC PANEL - Abnormal; Notable for the following components:      Result Value   Glucose, Bld 110 (*)    Albumin 3.3 (*)    AST 258 (*)    ALT 291 (*)    All other components within normal limits  CBC WITH DIFFERENTIAL/PLATELET  LIPASE, BLOOD  URINALYSIS, ROUTINE W REFLEX MICROSCOPIC  PROTIME-INR    EKG None  Radiology Ct Abdomen Pelvis W Contrast  Result Date: 02/22/2019 CLINICAL DATA:  Lower abdominal pain for 1 month. EXAM: CT ABDOMEN AND PELVIS WITH CONTRAST TECHNIQUE: Multidetector CT imaging of the abdomen and pelvis was performed using the standard protocol following bolus administration of intravenous contrast. CONTRAST:  OMNIPAQUE IOHEXOL 300 MG/ML  SOLN COMPARISON:  Abdominal x-ray on 06/23/2016 FINDINGS: Lower chest: No acute findings. There is a single prominent paraesophageal lymph node to the right of the distal esophagus and just above the diaphragmatic  hiatus measuring approximately 8 mm in short axis. This is nonspecific and is not associated with obvious adjacent esophageal wall thickening by CT. Hepatobiliary: The liver shows evidence of diffuse steatosis without evidence of overt cirrhosis. No focal hepatic mass identified. The gallbladder appears unremarkable and is contracted. No biliary ductal dilatation identified. Pancreas: Unremarkable. No pancreatic ductal dilatation or surrounding inflammatory changes. Spleen: Normal in size without focal abnormality. Adrenals/Urinary Tract: Adrenal glands are unremarkable. Kidneys  are normal, without renal calculi, focal lesion, or hydronephrosis. Bladder is unremarkable. Stomach/Bowel: No evidence of bowel obstruction, ileus or inflammatory process. The appendix is normal. No focal bowel lesions identified by CT. No free air identified. Vascular/Lymphatic: No significant vascular findings are present. No enlarged abdominal or pelvic lymph nodes. Reproductive: Prostate is unremarkable. Other: Evidence of prior left inguinal hernia repair without evidence of recurrent hernia. No free fluid or focal abscess. Musculoskeletal: No acute or significant osseous findings. IMPRESSION: 1. No acute findings in the abdomen or pelvis. 2. Nonspecific single prominent paraesophageal lymph node adjacent to the distal esophagus. 3. Diffuse hepatic steatosis. Electronically Signed   By: Irish Lack M.D.   On: 02/22/2019 15:40    Procedures Procedures (including critical care time)  Medications Ordered in ED Medications  iohexol (OMNIPAQUE) 300 MG/ML solution 100 mL (100 mLs Intravenous Contrast Given 02/22/19 1513)     Initial Impression / Assessment and Plan / ED Course  I have reviewed the triage vital signs and the nursing notes.  Pertinent labs & imaging results that were available during my care of the patient were reviewed by me and considered in my medical decision making (see chart for details).  Clinical  Course as of Feb 22 953  Tue Feb 22, 2019  1546 CAT scan not showing any acute findings.  They do note a single paraesophageal lymph node.  Will update the patient and have him follow-up with his primary care doctor.   [MB]    Clinical Course User Index [MB] Terrilee Files, MD        Final Clinical Impressions(s) / ED Diagnoses   Final diagnoses:  Generalized abdominal pain  Uncomplicated alcohol dependence [facility])    ED Discharge Orders    None       Terrilee Files, MD 02/23/19 [phone]

## 2019-02-22 NOTE — Discharge Instructions (Addendum)
You were seen in the emergency department for diffuse abdominal pain.  Your lab work showed some signs of elevation of your liver enzymes which is likely due to your drinking.  There were no serious findings on the CAT scan.  It will be important for you to drink in moderation and may be even to consider stopping drinking.  Please discuss with your primary care doctor.  Return to the emergency department if any concerns.

## 2019-02-22 NOTE — ED Triage Notes (Signed)
Pain to llq on and off. States he feels like his stomach has been "churning" x 1 month.  Denies pain. N/v/d

## 2020-05-14 IMAGING — CT CT ABDOMEN AND PELVIS WITH CONTRAST
2 of 5 series · 16 of 46 positions shown, 18 images · IV contrast (Isovue)
Comparison: Abdominal x-ray on 06/23/2016

CLINICAL DATA: Lower abdominal pain for 1 month.

EXAM:
CT ABDOMEN AND PELVIS WITH CONTRAST
TECHNIQUE: Multidetector CT imaging of the abdomen and pelvis was performed
using the standard protocol following bolus administration of
intravenous contrast.
CONTRAST:  100mL OMNIPAQUE IOHEXOL 300 MG/ML  SOLN

[Series 2: axial st · axial · 0.68mm/px · z∈[+1014,+1398]mm · 13 of 87 slices shown, 15 images]
[im 5/87  soft-tissue]
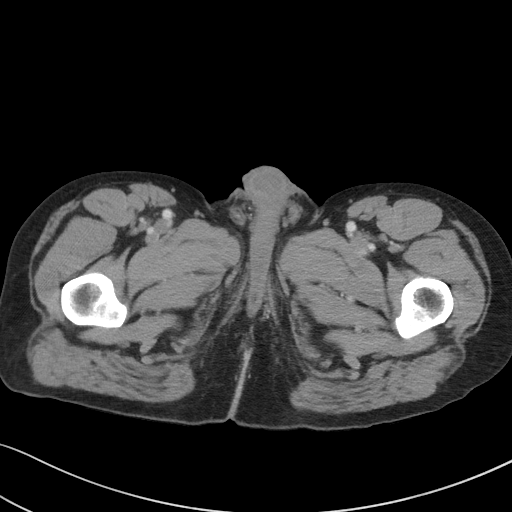
[im 5/87  bone]
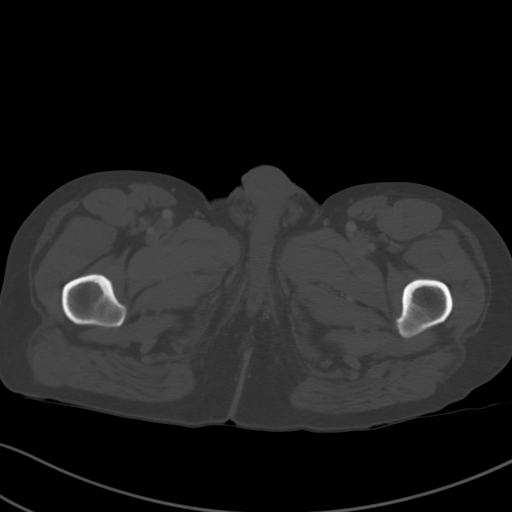
[im 10/87  soft-tissue]
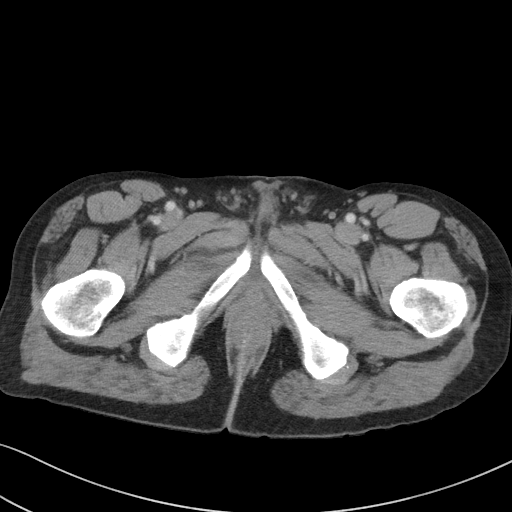
[im 20/87  soft-tissue]
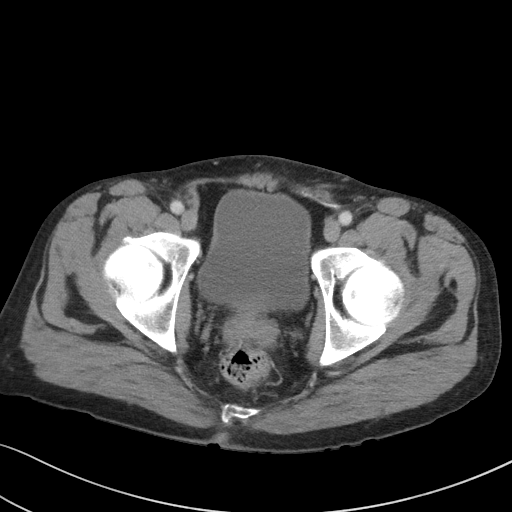
[im 24/87  soft-tissue]
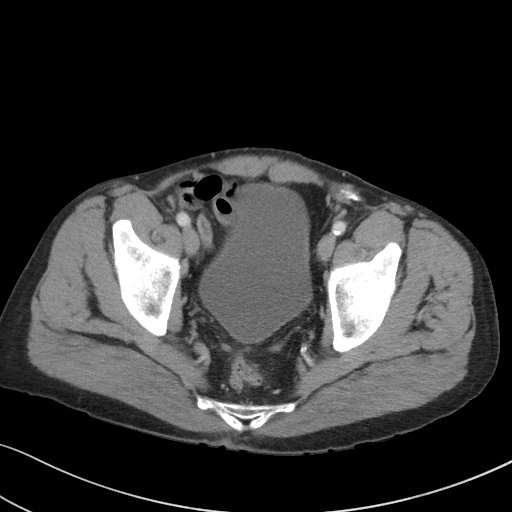
[im 29/87  soft-tissue]
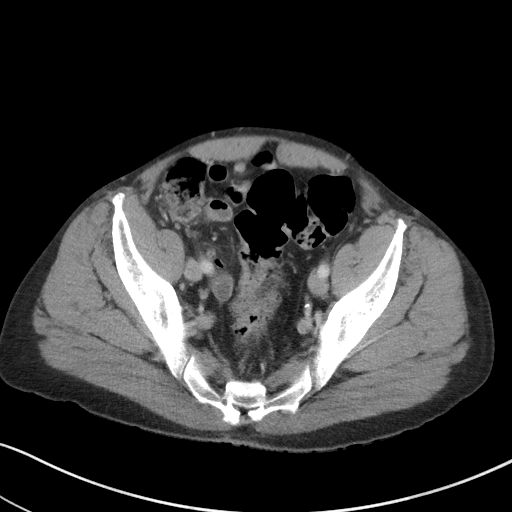
[im 39/87  soft-tissue]
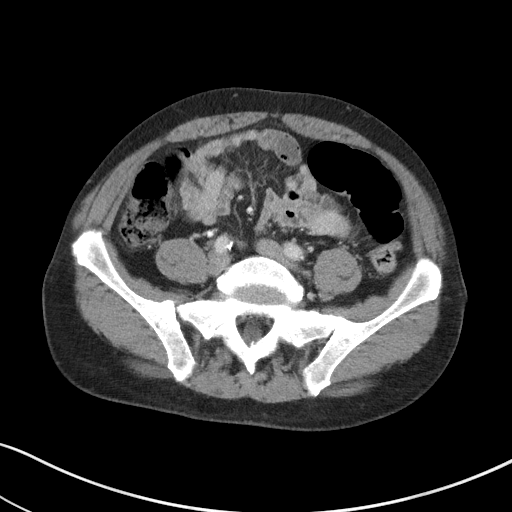
[im 44/87  soft-tissue]
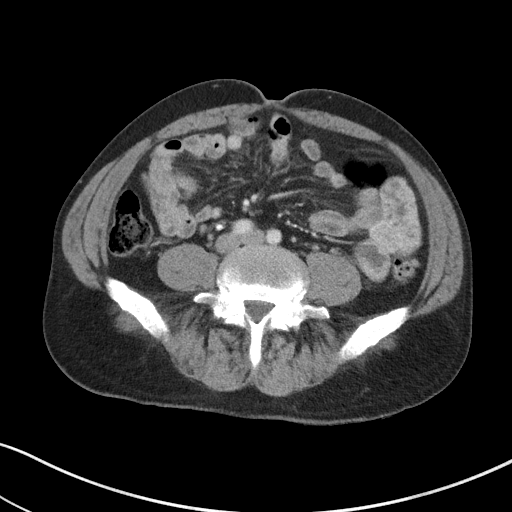
[im 48/87  soft-tissue]
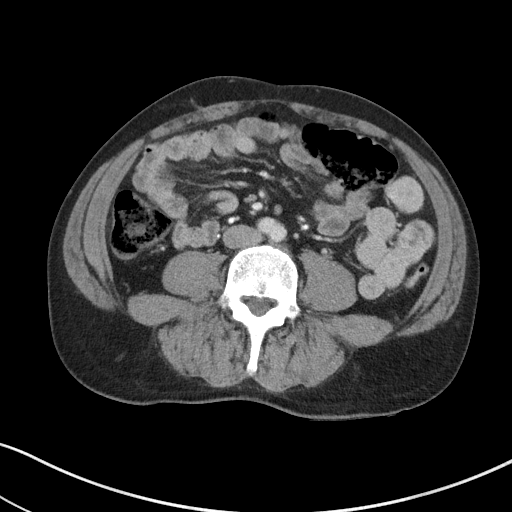
[im 58/87  soft-tissue]
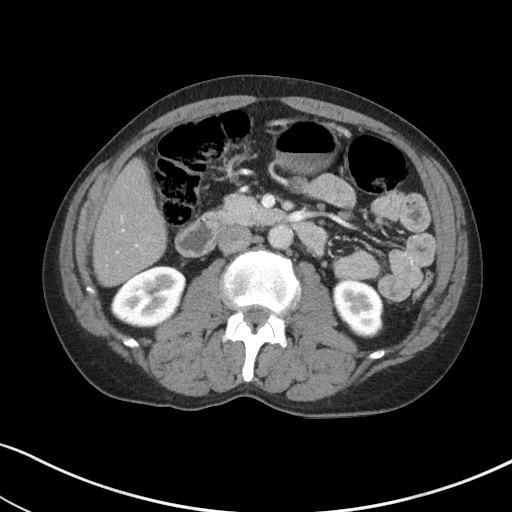
[im 58/87  bone]
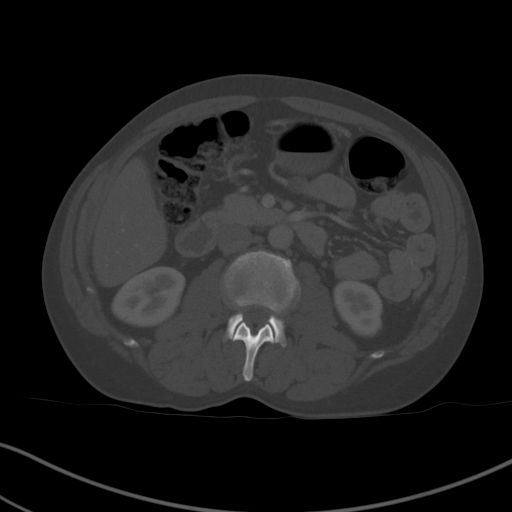
[im 63/87  soft-tissue]
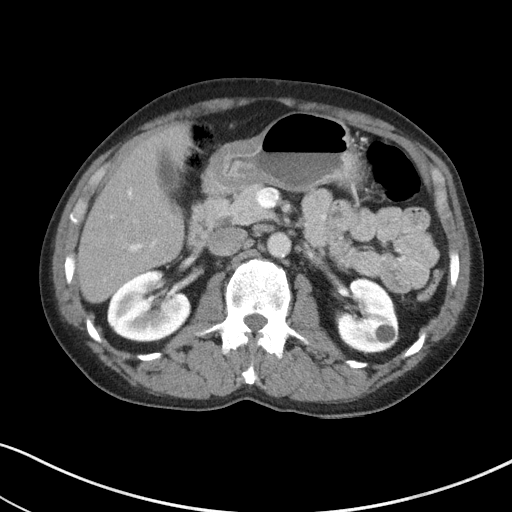
[im 67/87  soft-tissue]
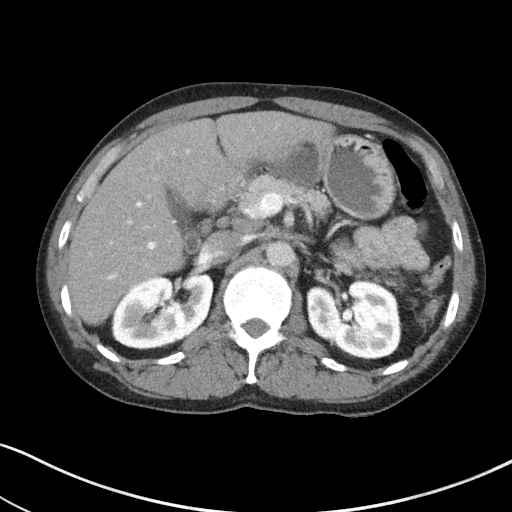
[im 77/87  soft-tissue]
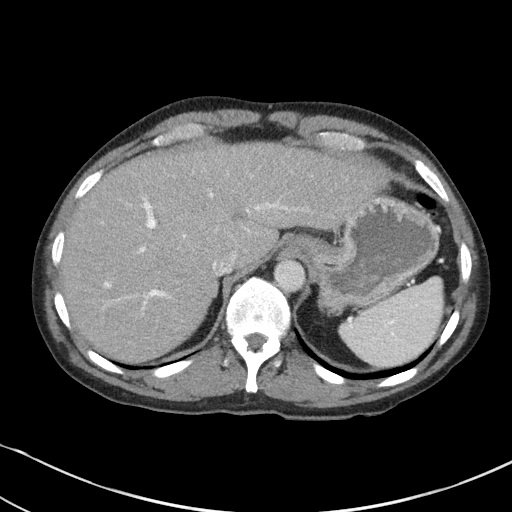
[im 82/87  soft-tissue]
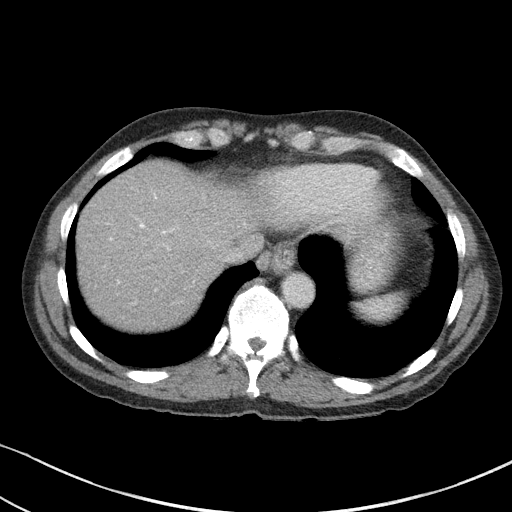

[Series 6: coronal st · coronal · 0.75mm/px · 3 of 85 slices shown]
[im 29/85  soft-tissue]
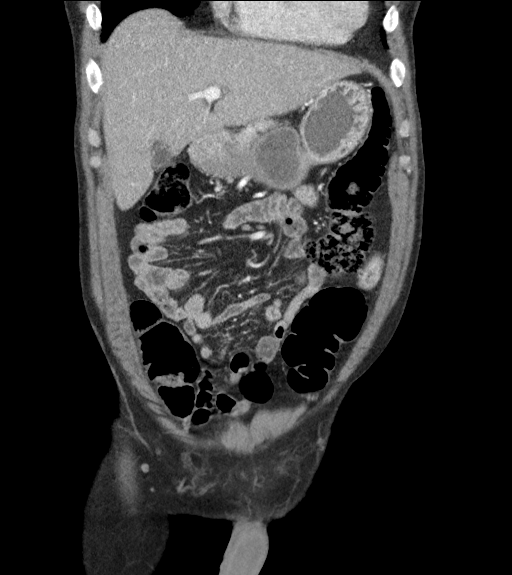
[im 38/85  soft-tissue]
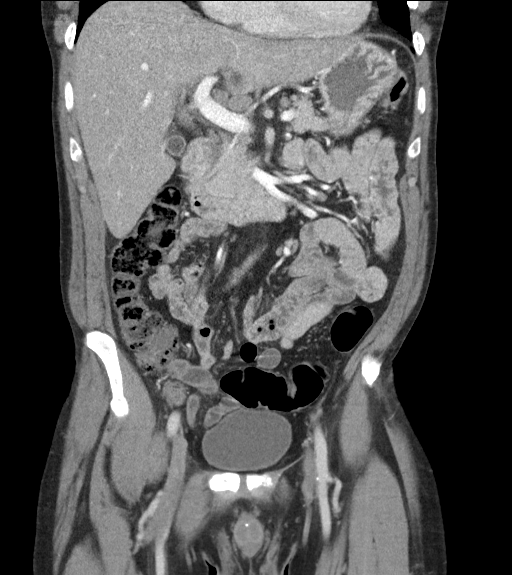
[im 47/85  soft-tissue]
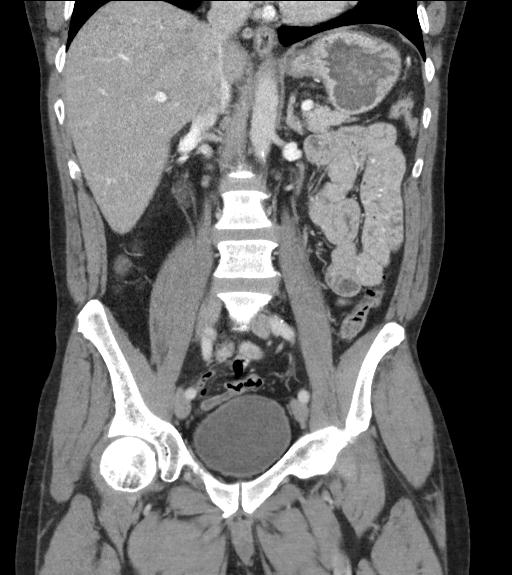

[16 of 46 positions shown; findings below may reference images not displayed]

FINDINGS: Lower chest: No acute findings. There is a single prominent
paraesophageal lymph node to the right of the distal esophagus and
just above the diaphragmatic hiatus measuring approximately 8 mm in
short axis. This is nonspecific and is not associated with obvious
adjacent esophageal wall thickening by CT.

Hepatobiliary: The liver shows evidence of diffuse steatosis without
evidence of overt cirrhosis. No focal hepatic mass identified. The
gallbladder appears unremarkable and is contracted. No biliary
ductal dilatation identified.

Pancreas: Unremarkable. No pancreatic ductal dilatation or
surrounding inflammatory changes.

Spleen: Normal in size without focal abnormality.

Adrenals/Urinary Tract: Adrenal glands are unremarkable. Kidneys are
normal, without renal calculi, focal lesion, or hydronephrosis.
Bladder is unremarkable.

Stomach/Bowel: No evidence of bowel obstruction, ileus or
inflammatory process. The appendix is normal. No focal bowel lesions
identified by CT. No free air identified.

Vascular/Lymphatic: No significant vascular findings are present. No
enlarged abdominal or pelvic lymph nodes.

Reproductive: Prostate is unremarkable.

Other: Evidence of prior left inguinal hernia repair without
evidence of recurrent hernia. No free fluid or focal abscess.

Musculoskeletal: No acute or significant osseous findings.
IMPRESSION: 1. No acute findings in the abdomen or pelvis.
2. Nonspecific single prominent paraesophageal lymph node adjacent
to the distal esophagus.
3. Diffuse hepatic steatosis.

## 2022-06-06 ENCOUNTER — Emergency Department (HOSPITAL_COMMUNITY): Payer: Medicare HMO

## 2022-06-06 ENCOUNTER — Encounter (HOSPITAL_COMMUNITY): Payer: Self-pay

## 2022-06-06 ENCOUNTER — Other Ambulatory Visit: Payer: Self-pay

## 2022-06-06 ENCOUNTER — Emergency Department (HOSPITAL_COMMUNITY)
Admission: EM | Admit: 2022-06-06 | Discharge: 2022-06-06 | Disposition: A | Discharge status: Home / Self Care | Payer: Medicare HMO | Attending: Emergency Medicine | Admitting: Emergency Medicine

## 2022-06-06 DIAGNOSIS — D805 Immunodeficiency with increased immunoglobulin M [IgM]: Secondary | ICD-10-CM

## 2022-06-06 DIAGNOSIS — K2900 Acute gastritis without bleeding: Secondary | ICD-10-CM | POA: Diagnosis not present

## 2022-06-06 DIAGNOSIS — R1084 Generalized abdominal pain: Secondary | ICD-10-CM | POA: Diagnosis present

## 2022-06-06 LAB — CBC WITH DIFFERENTIAL/PLATELET
Abs Immature Granulocytes: 0.02 10*3/uL (ref 0.00–0.07)
Basophils Absolute: 0.1 10*3/uL (ref 0.0–0.1)
Basophils Relative: 1 %
Eosinophils Absolute: 0.3 10*3/uL (ref 0.0–0.5)
Eosinophils Relative: 4 %
HCT: 39.1 % (ref 39.0–52.0)
Hemoglobin: 13.3 g/dL (ref 13.0–17.0)
Immature Granulocytes: 0 %
Lymphocytes Relative: 49 %
Lymphs Abs: 4.8 10*3/uL — ABNORMAL HIGH (ref 0.7–4.0)
MCH: 32.8 pg (ref 26.0–34.0)
MCHC: 34 g/dL (ref 30.0–36.0)
MCV: 96.5 fL (ref 80.0–100.0)
Monocytes Absolute: 0.9 10*3/uL (ref 0.1–1.0)
Monocytes Relative: 10 %
Neutro Abs: 3.3 10*3/uL (ref 1.7–7.7)
Neutrophils Relative %: 36 %
Platelets: 141 10*3/uL — ABNORMAL LOW (ref 150–400)
RBC: 4.05 MIL/uL — ABNORMAL LOW (ref 4.22–5.81)
RDW: 14.6 % (ref 11.5–15.5)
WBC: 9.4 10*3/uL (ref 4.0–10.5)
nRBC: 0 % (ref 0.0–0.2)

## 2022-06-06 LAB — URINALYSIS, ROUTINE W REFLEX MICROSCOPIC
Bilirubin Urine: NEGATIVE
Glucose, UA: NEGATIVE mg/dL
Hgb urine dipstick: NEGATIVE
Ketones, ur: NEGATIVE mg/dL
Leukocytes,Ua: NEGATIVE
Nitrite: NEGATIVE
Protein, ur: NEGATIVE mg/dL
Specific Gravity, Urine: 1.017 (ref 1.005–1.030)
pH: 5 (ref 5.0–8.0)

## 2022-06-06 LAB — COMPREHENSIVE METABOLIC PANEL
ALT: 101 U/L — ABNORMAL HIGH (ref 0–44)
AST: 107 U/L — ABNORMAL HIGH (ref 15–41)
Albumin: 3.4 g/dL — ABNORMAL LOW (ref 3.5–5.0)
Alkaline Phosphatase: 72 U/L (ref 38–126)
Anion gap: 7 (ref 5–15)
BUN: 23 mg/dL (ref 8–23)
CO2: 22 mmol/L (ref 22–32)
Calcium: 8.3 mg/dL — ABNORMAL LOW (ref 8.9–10.3)
Chloride: 109 mmol/L (ref 98–111)
Creatinine, Ser: 1.24 mg/dL (ref 0.61–1.24)
GFR, Estimated: >60 mL/min (ref >60)
Glucose, Bld: 116 mg/dL — ABNORMAL HIGH (ref 70–99)
Potassium: 3.8 mmol/L (ref 3.5–5.1)
Sodium: 138 mmol/L (ref 135–145)
Total Bilirubin: 0.6 mg/dL (ref 0.3–1.2)
Total Protein: 7.1 g/dL (ref 6.5–8.1)

## 2022-06-06 LAB — TROPONIN I (HIGH SENSITIVITY)
Troponin I (High Sensitivity): 3 ng/L (ref <18)
Troponin I (High Sensitivity): 3 ng/L (ref <18)

## 2022-06-06 LAB — LIPASE, BLOOD: Lipase: 50 U/L (ref 11–51)

## 2022-06-06 MED ORDER — ALUM & MAG HYDROXIDE-SIMETH 200-200-20 MG/5ML PO SUSP
30.0000 mL | Freq: Once | ORAL | Status: AC
  Administered 2022-06-06: 30 mL via ORAL
  Filled 2022-06-06: qty 30

## 2022-06-06 MED ORDER — LIDOCAINE VISCOUS HCL 2 % MT SOLN
15.0000 mL | Freq: Once | OROMUCOSAL | Status: AC
Start: 2022-06-06 — End: 2022-06-06
  Administered 2022-06-06: 15 mL via ORAL
  Filled 2022-06-06: qty 15

## 2022-06-06 MED ORDER — FAMOTIDINE IN NACL 20-0.9 MG/50ML-% IV SOLN
20.0000 mg | Freq: Once | INTRAVENOUS | Status: AC
  Administered 2022-06-06: 20 mg via INTRAVENOUS
  Filled 2022-06-06: qty 50

## 2022-06-06 MED ORDER — LANSOPRAZOLE 30 MG PO CPDR: 30.0000 mg | DELAYED_RELEASE_CAPSULE | Freq: Every day | ORAL | 1 refills | Status: DC

## 2022-06-06 NOTE — ED Triage Notes (Signed)
Pt presents to ED with complaints of RUQ abdominal tightening and LLQ pain x 1 week. Pt denies N/V/D.

## 2022-06-06 NOTE — ED Provider Notes (Signed)
[facility] EMERGENCY DEPARTMENT Provider Note   CSN: [id number] Arrival date & time: 06/06/22  1534     History  Chief Complaint  Patient presents with   Abdominal Pain    Randy Ashley is a 66 y.o. male.  Patient reports that he feels like his stomach is rolling.  Patient reports he has had some discomfort in the right side of his chest for the past week.  Patient admits to drinking a significant amount of alcohol.  Denies any fever or chills he denies any cough or congestion.  Denies any black or tarry looking stools he has not had any diarrhea or vomiting.  Patient is not currently taking any medications.  He reports he has not seen his primary care physician in over 5 years.  He is not currently taking blood pressure medicine  The history is provided by the patient. No language interpreter was used.  Abdominal Pain Pain location:  Generalized Pain quality: aching   Pain radiates to:  Does not radiate Pain severity:  Moderate Onset quality:  Gradual Duration:  1 week Timing:  Constant Progression:  Worsening Chronicity:  New Context: alcohol use   Relieved by:  Nothing Ineffective treatments:  None tried Associated symptoms: no nausea   Risk factors: alcohol abuse        Home Medications Prior to Admission medications   Medication Sig Start Date End Date Taking? Authorizing Provider  lansoprazole (PREVACID) 30 MG capsule Take 1 capsule (30 mg total) by mouth daily. 06/06/22 06/06/23 Yes Elson Areas, PA-C      Allergies    Patient has no known allergies.    Review of Systems   Review of Systems  Gastrointestinal:  Positive for abdominal pain. Negative for nausea.  All other systems reviewed and are negative.   Physical Exam Updated Vital Signs BP (!) 187/81   Pulse 63   Temp 97.9 F (36.6 C) (Oral)   Resp (!) 23   Ht 5\' 6"  (1.676 m)   Wt 66.3 kg   SpO2 100%   BMI 23.58 kg/m  Physical Exam Vitals and nursing note reviewed.  Constitutional:       Appearance: He is well-developed.  HENT:     Head: Normocephalic.  Cardiovascular:     Rate and Rhythm: Normal rate and regular rhythm.  Pulmonary:     Effort: Pulmonary effort is normal.  Abdominal:     General: Bowel sounds are normal. There is no distension.     Palpations: Abdomen is soft.     Tenderness: There is abdominal tenderness.  Musculoskeletal:        General: Normal range of motion.     Cervical back: Normal range of motion.  Skin:    General: Skin is warm.  Neurological:     General: No focal deficit present.     Mental Status: He is alert and oriented to person, place, and time.     ED Results / Procedures / Treatments   Labs (all labs ordered are listed, but only abnormal results are displayed) Labs Reviewed  CBC WITH DIFFERENTIAL/PLATELET - Abnormal; Notable for the following components:      Result Value   RBC 4.05 (*)    Platelets 141 (*)    Lymphs Abs 4.8 (*)    All other components within normal limits  COMPREHENSIVE METABOLIC PANEL - Abnormal; Notable for the following components:   Glucose, Bld 116 (*)    Calcium 8.3 (*)    Albumin  3.4 (*)    AST 107 (*)    ALT 101 (*)    All other components within normal limits  LIPASE, BLOOD  URINALYSIS, ROUTINE W REFLEX MICROSCOPIC  TROPONIN I (HIGH SENSITIVITY)  TROPONIN I (HIGH SENSITIVITY)    EKG EKG Interpretation  Date/Time:  Friday June 06 2022 17:38:04 EDT Ventricular Rate:  55 PR Interval:  180 QRS Duration: 95 QT Interval:  450 QTC Calculation: 431 R Axis:   22 Text Interpretation: Sinus rhythm Anterior infarct, old No significant change since prior 10/12 Confirmed by Meridee Score [phone]) on 06/06/2022 5:42:11 PM  Radiology DG Chest Port 1 View  Result Date: 06/06/2022 CLINICAL DATA:  Upper abdominal pain. EXAM: PORTABLE CHEST 1 VIEW COMPARISON:  06/11/2010 FINDINGS: The heart size and mediastinal contours are within normal limits. Both lungs are clear. The visualized skeletal  structures are unremarkable. IMPRESSION: No active disease. Electronically Signed   By: Danae Orleans M.D.   On: 06/06/2022 17:40    Procedures Procedures    Medications Ordered in ED Medications  famotidine (PEPCID) IVPB 20 mg premix (0 mg Intravenous Stopped 06/06/22 2251)  alum & mag hydroxide-simeth (MAALOX/MYLANTA) 200-200-20 MG/5ML suspension 30 mL (30 mLs Oral Given 06/06/22 2129)    And  lidocaine (XYLOCAINE) 2 % viscous mouth solution 15 mL (15 mLs Oral Given 06/06/22 2129)    ED Course/ Medical Decision Making/ A&P                           Medical Decision Making Patient complains of right-sided chest pain and feeling like his abdomen is gassy.  Amount and/or Complexity of Data Reviewed Labs: ordered. Decision-making details documented in ED Course.    Details: Labs ordered reviewed and interpreted patient has an elevated AST of 107 and ALT of 101 troponin is negative x2 Radiology: ordered and independent interpretation performed. Decision-making details documented in ED Course.    Details: Chest x-ray is ordered reviewed and interpreted it shows no evidence of cardiopulmonary disease ECG/medicine tests: ordered and independent interpretation performed. Decision-making details documented in ED Course.    Details: EKG normal sinus rhythm no acute abnormality  Risk OTC drugs. Prescription drug management. Risk Details: Patient given a GI cocktail and Pepcid IV.  Patient informed nursing staff that he felt better and wanted to leave.  Patient took his own IV out and was leaving the part department.  And is given a prescription for Prevacid for his symptoms he is advised to avoid alcohol.  He is advised to schedule to see Dr. Sudie Bailey for recheck symptoms and follow-up on his blood pressure           Final Clinical Impression(s) / ED Diagnoses Final diagnoses:  Acute gastritis without hemorrhage, unspecified gastritis type    Rx / DC Orders ED Discharge Orders           Ordered    lansoprazole (PREVACID) 30 MG capsule  Daily        06/06/22 2247           An After Visit Summary was printed and given to the patient.    Osie Cheeks 06/06/22 2259    Terrilee Files, MD 06/07/22 1043

## 2022-06-06 NOTE — Discharge Instructions (Signed)
Avoid alcohol.

## 2022-06-15 ENCOUNTER — Other Ambulatory Visit: Payer: Self-pay

## 2022-06-15 ENCOUNTER — Encounter (HOSPITAL_COMMUNITY): Payer: Self-pay

## 2022-06-15 ENCOUNTER — Emergency Department (HOSPITAL_COMMUNITY): Payer: Medicare HMO

## 2022-06-15 ENCOUNTER — Emergency Department (HOSPITAL_COMMUNITY)
Admission: EM | Admit: 2022-06-15 | Discharge: 2022-06-15 | Disposition: A | Discharge status: Home / Self Care | Payer: Medicare HMO | Attending: Emergency Medicine | Admitting: Emergency Medicine

## 2022-06-15 DIAGNOSIS — R0789 Other chest pain: Secondary | ICD-10-CM | POA: Diagnosis present

## 2022-06-15 DIAGNOSIS — R55 Syncope and collapse: Secondary | ICD-10-CM | POA: Diagnosis not present

## 2022-06-15 DIAGNOSIS — R079 Chest pain, unspecified: Secondary | ICD-10-CM

## 2022-06-15 DIAGNOSIS — F17200 Nicotine dependence, unspecified, uncomplicated: Secondary | ICD-10-CM | POA: Insufficient documentation

## 2022-06-15 LAB — COMPREHENSIVE METABOLIC PANEL
ALT: 99 U/L — ABNORMAL HIGH (ref 0–44)
AST: 97 U/L — ABNORMAL HIGH (ref 15–41)
Albumin: 3.4 g/dL — ABNORMAL LOW (ref 3.5–5.0)
Alkaline Phosphatase: 69 U/L (ref 38–126)
Anion gap: 7 (ref 5–15)
BUN: 17 mg/dL (ref 8–23)
CO2: 23 mmol/L (ref 22–32)
Calcium: 8.5 mg/dL — ABNORMAL LOW (ref 8.9–10.3)
Chloride: 110 mmol/L (ref 98–111)
Creatinine, Ser: 1.17 mg/dL (ref 0.61–1.24)
GFR, Estimated: >60 mL/min (ref >60)
Glucose, Bld: 141 mg/dL — ABNORMAL HIGH (ref 70–99)
Potassium: 3.6 mmol/L (ref 3.5–5.1)
Sodium: 140 mmol/L (ref 135–145)
Total Bilirubin: 0.6 mg/dL (ref 0.3–1.2)
Total Protein: 7.1 g/dL (ref 6.5–8.1)

## 2022-06-15 LAB — CBC WITH DIFFERENTIAL/PLATELET
Abs Immature Granulocytes: 0.01 10*3/uL (ref 0.00–0.07)
Basophils Absolute: 0 10*3/uL (ref 0.0–0.1)
Basophils Relative: 1 %
Eosinophils Absolute: 0.3 10*3/uL (ref 0.0–0.5)
Eosinophils Relative: 4 %
HCT: 37.9 % — ABNORMAL LOW (ref 39.0–52.0)
Hemoglobin: 12.9 g/dL — ABNORMAL LOW (ref 13.0–17.0)
Immature Granulocytes: 0 %
Lymphocytes Relative: 51 %
Lymphs Abs: 3.6 10*3/uL (ref 0.7–4.0)
MCH: 32.7 pg (ref 26.0–34.0)
MCHC: 34 g/dL (ref 30.0–36.0)
MCV: 95.9 fL (ref 80.0–100.0)
Monocytes Absolute: 0.6 10*3/uL (ref 0.1–1.0)
Monocytes Relative: 9 %
Neutro Abs: 2.5 10*3/uL (ref 1.7–7.7)
Neutrophils Relative %: 35 %
Platelets: 125 10*3/uL — ABNORMAL LOW (ref 150–400)
RBC: 3.95 MIL/uL — ABNORMAL LOW (ref 4.22–5.81)
RDW: 14.3 % (ref 11.5–15.5)
WBC: 7.1 10*3/uL (ref 4.0–10.5)
nRBC: 0 % (ref 0.0–0.2)

## 2022-06-15 LAB — TROPONIN I (HIGH SENSITIVITY): Troponin I (High Sensitivity): 4 ng/L (ref <18)

## 2022-06-15 NOTE — ED Triage Notes (Signed)
Pt presents to ED with complaints of right sided chest pain. Pt states it continues from previous visit. Pt states he felt like he was going to pass out this am.

## 2022-06-15 NOTE — ED Provider Notes (Signed)
[facility] EMERGENCY DEPARTMENT Provider Note   CSN: [id number] Arrival date & time: 06/15/22  1801     History  Chief Complaint  Patient presents with   Chest Pain    Randy Ashley is a 66 y.o. male.  HPI     66 year old male comes in with chief complaint of chest pain.  Patient indicates that he has been having right-sided chest pain over the last week.  He was seen in the ER last week, they focused on his abdominal pain but did not focus on his chest pain.  His current chest pain is right-sided, it is constant, worse when he is upright, it is nonradiating.  Pain is no pleuritic component.  He denies any cough.  He denies any association of the symptoms with food intake.  Pt has no hx of PE, DVT and denies any exogenous hormone (testosterone / estrogen) use, long distance travels or surgery in the past 6 weeks, active cancer, recent immobilization.  Patient does admit to smoking 1 pack a day for the last 40+ years. 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  He did not have any chest pain, shortness of breath, back pain at that time.  He also denies any palpitations.  Home Medications Prior to Admission medications   Medication Sig Start Date End Date Taking? Authorizing Provider  lansoprazole (PREVACID) 30 MG capsule Take 1 capsule (30 mg total) by mouth daily. 06/06/22 06/06/23 Yes Elson Areas, PA-C      Allergies    Patient has no known allergies.    Review of Systems   Review of Systems  All other systems reviewed and are negative.   Physical Exam Updated Vital Signs BP (!) 153/74   Pulse (!) 57   Temp 98 F (36.7 C) (Oral)   Resp 16   Ht 5\' 6"  (1.676 m)   Wt 66.1 kg   SpO2 98%   BMI 23.52 kg/m  Physical Exam Vitals and nursing note reviewed.  Constitutional:      Appearance: He is well-developed.  HENT:     Head: Atraumatic.  Eyes:     Extraocular Movements: Extraocular movements  intact.     Pupils: Pupils are equal, round, and reactive to light.  Neck:     Vascular: No JVD.  Cardiovascular:     Rate and Rhythm: Normal rate.     Pulses:          Radial pulses are 2+ on the right side and 2+ on the left side.       Dorsalis pedis pulses are 2+ on the right side and 2+ on the left side.     Heart sounds: Normal heart sounds. No murmur heard. Pulmonary:     Effort: Pulmonary effort is normal.  Musculoskeletal:     Cervical back: Neck supple.  Skin:    General: Skin is warm.  Neurological:     Mental Status: He is alert and oriented to person, place, and time.     Cranial Nerves: No cranial nerve deficit.     Motor: No weakness.     ED Results / Procedures / Treatments   Labs (all labs ordered are listed, but only abnormal results are displayed) Labs Reviewed  COMPREHENSIVE METABOLIC PANEL - Abnormal; Notable for the following components:      Result Value   Glucose, Bld 141 (*)    Calcium 8.5 (*)  Albumin 3.4 (*)    AST 97 (*)    ALT 99 (*)    All other components within normal limits  CBC WITH DIFFERENTIAL/PLATELET - Abnormal; Notable for the following components:   RBC 3.95 (*)    Hemoglobin 12.9 (*)    HCT 37.9 (*)    Platelets 125 (*)    All other components within normal limits  TROPONIN I (HIGH SENSITIVITY)    EKG EKG Interpretation  Date/Time:  Sunday June 15 2022 18:16:00 EDT Ventricular Rate:  68 PR Interval:  172 QRS Duration: 96 QT Interval:  406 QTC Calculation: 432 R Axis:   18 Text Interpretation: Sinus rhythm Low voltage, precordial leads Probable anteroseptal infarct, old No acute changes No significant change since last tracing Nonspecific ST abnormality Confirmed by Derwood Kaplan [phone]) on 06/15/2022 6:34:50 PM  Radiology DG Chest 2 View  Result Date: 06/15/2022 CLINICAL DATA:  Chest pain. EXAM: CHEST - 2 VIEW COMPARISON:  Chest x-ray June 30, 23. FINDINGS: The heart size and mediastinal contours are within normal  limits. Both lungs are clear. No visible pleural effusions or pneumothorax. No acute osseous abnormality. IMPRESSION: No active cardiopulmonary disease. Electronically Signed   By: Feliberto Harts M.D.   On: 06/15/2022 19:37    Procedures Procedures    Medications Ordered in ED Medications - No data to display  ED Course/ Medical Decision Making/ A&P                           Medical Decision Making Amount and/or Complexity of Data Reviewed Labs: ordered. Radiology: ordered.   This patient presents to the ED with chief complaint(s) of right-sided chest pain, which is atypical and constant with pertinent past medical history of heavy tobacco use disorder history and hypertension.  Patient also reports a near syncope episode earlier today which further complicates the presenting complaint. The complaint involves an extensive differential diagnosis and also carries with it a high risk of complications and morbidity.    The differential diagnosis for this atypical, nonpleuritic, nonradiating, nonexertional chest pain includes atypical presentation of ACS, pericarditis, myocarditis, chest wall pain.  Given his extensive smoking history, thoracic artery aneurysm and dissection were considered in the differential diagnosis.  Patient has normal 2+ radial pulse, dorsalis pedis and has no neurologic complaints.  I do not think he is having dissection.  Additionally, patient had a near fainting spell.  It appears that he was pushing a cart when he started feeling weak, and felt like he was going to faint and fall.  No similar episodes in the recent past.  Arrhythmia possible along with valvular disorder.   The initial plan is to get basic labs, chest x-ray and EKG. I will discuss the findings with him and we will have a shared decision making on whether to proceed with CT angiogram or not.   Additional history obtained: Records reviewed  recent work-up in the ER, which included x-ray of the  chest, troponin x2 and also CT scan from 2020 that showed no evidence of AAA  Independent labs interpretation:  The following labs were independently interpreted: Basic blood work-up and troponin which is reassuring.  Independent visualization of imaging: - I independently visualized the following imaging with scope of interpretation limited to determining acute life threatening conditions related to emergency care: X-ray of the chest, which revealed no evidence of mediastinal widening.  No pneumothorax.  Treatment and Reassessment: Results of the ER work-up discussed  with the patient. He was made aware that his troponin, basic blood work-up and chest x-ray is fine.  His EKG has no new changes.  This is patient's repeat visit to the ER for this complaint and he has not seen a PCP for the last 5 years, the latter being a social determinant of health for him.  I will place a cardiology consultation so at least we can ensure that he can get an echocardiogram given this near syncope episode and assessment by them to see if patient needs a stress test.   Strict ER return precautions discussed with the patient.  He will return to the ER if he starts having excruciating chest pain, shortness of breath, near fainting or fainting spell.  Final Clinical Impression(s) / ED Diagnoses Final diagnoses:  Right-sided chest pain  Near syncope    Rx / DC Orders ED Discharge Orders          Ordered    Ambulatory referral to Cardiology       Comments: If you have not heard from the Cardiology office within the next 72 hours please call [phone].   06/15/22 Theressa Millard, MD 06/15/22 2004

## 2022-06-15 NOTE — Discharge Instructions (Signed)
The work-up in the emergency room is reassuring.  Your heart enzyme is fine.  Your EKG does not show any concerning findings.  However, it is prudent that you follow-up with your primary care doctor for further assessment.  We have put in a referral for cardiology service given the nonspecific chest pain and near fainting spells, for them to ensure that you are not having any concerning cardiac disease.  Return to the emergency room immediately if you start having severe chest pain, shortness of breath, loss of consciousness.

## 2022-07-02 ENCOUNTER — Other Ambulatory Visit (HOSPITAL_COMMUNITY): Payer: Self-pay | Admitting: Family Medicine

## 2022-07-02 DIAGNOSIS — R079 Chest pain, unspecified: Secondary | ICD-10-CM

## 2022-07-04 ENCOUNTER — Other Ambulatory Visit (HOSPITAL_COMMUNITY): Payer: Self-pay | Admitting: Family Medicine

## 2022-07-04 ENCOUNTER — Other Ambulatory Visit: Payer: Self-pay | Admitting: Family Medicine

## 2022-07-04 DIAGNOSIS — R079 Chest pain, unspecified: Secondary | ICD-10-CM

## 2022-07-11 ENCOUNTER — Ambulatory Visit (HOSPITAL_COMMUNITY)
Admission: RE | Admit: 2022-07-11 | Discharge: 2022-07-11 | Disposition: A | Discharge status: Home / Self Care | Payer: Medicare HMO | Source: Ambulatory Visit | Attending: Family Medicine | Admitting: Family Medicine

## 2022-07-11 DIAGNOSIS — R079 Chest pain, unspecified: Secondary | ICD-10-CM | POA: Diagnosis not present

## 2022-07-11 LAB — ECHOCARDIOGRAM COMPLETE
AR max vel: 2.21 cm2
AV Area VTI: 2.32 cm2
AV Area mean vel: 1.99 cm2
AV Mean grad: 3 mmHg
AV Peak grad: 5.2 mmHg
Ao pk vel: 1.14 m/s
Area-P 1/2: 3.37 cm2
Calc EF: 53.9 %
MV VTI: 2.15 cm2
S' Lateral: 3 cm
Single Plane A2C EF: 48 %
Single Plane A4C EF: 59.2 %

## 2022-07-22 ENCOUNTER — Ambulatory Visit (HOSPITAL_COMMUNITY): Payer: Medicare HMO

## 2022-07-22 ENCOUNTER — Encounter (HOSPITAL_COMMUNITY): Payer: Self-pay

## 2022-08-05 ENCOUNTER — Encounter (HOSPITAL_COMMUNITY): Payer: Self-pay

## 2022-08-05 ENCOUNTER — Other Ambulatory Visit: Payer: Self-pay

## 2022-08-05 ENCOUNTER — Emergency Department (HOSPITAL_COMMUNITY): Payer: Medicare HMO

## 2022-08-05 ENCOUNTER — Emergency Department (HOSPITAL_COMMUNITY)
Admission: EM | Admit: 2022-08-05 | Discharge: 2022-08-05 | Disposition: A | Discharge status: Home / Self Care | Payer: Medicare HMO | Attending: Emergency Medicine | Admitting: Emergency Medicine

## 2022-08-05 DIAGNOSIS — I160 Hypertensive urgency: Secondary | ICD-10-CM | POA: Insufficient documentation

## 2022-08-05 DIAGNOSIS — Z7982 Long term (current) use of aspirin: Secondary | ICD-10-CM | POA: Insufficient documentation

## 2022-08-05 DIAGNOSIS — R42 Dizziness and giddiness: Secondary | ICD-10-CM | POA: Diagnosis present

## 2022-08-05 LAB — CBC WITH DIFFERENTIAL/PLATELET
Abs Immature Granulocytes: 0 10*3/uL (ref 0.00–0.07)
Basophils Absolute: 0.1 10*3/uL (ref 0.0–0.1)
Basophils Relative: 1 %
Eosinophils Absolute: 0.4 10*3/uL (ref 0.0–0.5)
Eosinophils Relative: 6 %
HCT: 42.7 % (ref 39.0–52.0)
Hemoglobin: 14.4 g/dL (ref 13.0–17.0)
Immature Granulocytes: 0 %
Lymphocytes Relative: 38 %
Lymphs Abs: 2.5 10*3/uL (ref 0.7–4.0)
MCH: 32.8 pg (ref 26.0–34.0)
MCHC: 33.7 g/dL (ref 30.0–36.0)
MCV: 97.3 fL (ref 80.0–100.0)
Monocytes Absolute: 0.6 10*3/uL (ref 0.1–1.0)
Monocytes Relative: 10 %
Neutro Abs: 3 10*3/uL (ref 1.7–7.7)
Neutrophils Relative %: 45 %
Platelets: 128 10*3/uL — ABNORMAL LOW (ref 150–400)
RBC: 4.39 MIL/uL (ref 4.22–5.81)
RDW: 14 % (ref 11.5–15.5)
WBC: 6.6 10*3/uL (ref 4.0–10.5)
nRBC: 0 % (ref 0.0–0.2)

## 2022-08-05 LAB — BASIC METABOLIC PANEL
Anion gap: 6 (ref 5–15)
BUN: 16 mg/dL (ref 8–23)
CO2: 23 mmol/L (ref 22–32)
Calcium: 8.5 mg/dL — ABNORMAL LOW (ref 8.9–10.3)
Chloride: 108 mmol/L (ref 98–111)
Creatinine, Ser: 1.21 mg/dL (ref 0.61–1.24)
GFR, Estimated: >60 mL/min (ref >60)
Glucose, Bld: 120 mg/dL — ABNORMAL HIGH (ref 70–99)
Potassium: 4 mmol/L (ref 3.5–5.1)
Sodium: 137 mmol/L (ref 135–145)

## 2022-08-05 MED ORDER — SODIUM CHLORIDE 0.9 % IV BOLUS
1000.0000 mL | Freq: Once | INTRAVENOUS | Status: AC
  Administered 2022-08-05: 1000 mL via INTRAVENOUS

## 2022-08-05 MED ORDER — AMLODIPINE BESYLATE 5 MG PO TABS
5.0000 mg | ORAL_TABLET | Freq: Once | ORAL | Status: AC
  Administered 2022-08-05: 5 mg via ORAL
  Filled 2022-08-05: qty 1

## 2022-08-05 MED ORDER — ASPIRIN 81 MG PO CHEW: 81.0000 mg | CHEWABLE_TABLET | Freq: Every day | ORAL | 0 refills | Status: DC

## 2022-08-05 MED ORDER — AMLODIPINE BESYLATE 2.5 MG PO TABS: 2.5000 mg | ORAL_TABLET | Freq: Every day | ORAL | 0 refills | Status: DC

## 2022-08-05 NOTE — Discharge Instructions (Addendum)
Blood pressure is quite elevated today.  Take the Norvasc/blood pressure medicine starting tomorrow as you were given the first dose today.  You will need to follow-up closely with your primary care physician and get a blood pressure recheck, ideally in around 1 week.  Your CT scan shows that at some point in your life you have had a stroke.  I would like you to start a baby aspirin.  You can also follow-up with your primary care physician regarding this.  If you develop headache, vision changes, slurred speech, new or worsening dizziness, passing out or trouble walking, or any other new/concerning symptoms then return to the ER for evaluation.

## 2022-08-05 NOTE — ED Triage Notes (Addendum)
Pt complaining dizziness that started this morning. States seen multiple times for the same thing. Denies any other symptoms.   Pt hypertensive and states that he has not been diagnosed with HBP and does not take any meds for it.

## 2022-08-05 NOTE — ED Provider Notes (Signed)
[facility] EMERGENCY DEPARTMENT Provider Note   CSN: [id number] Arrival date & time: 08/05/22  0549     History  Chief Complaint  Patient presents with   Dizziness    Randy Ashley is a 66 y.o. male.  HPI 66 year old male presents with dizziness and trouble walking.  At first he tells me has been ongoing for about a week but states he is also been here for the same multiple times.  Later he tells me it is probably been going on for months.  If he is laying still he feels fine but sometimes and not every time when he stands up he will have trouble walking.  He is not sure if it is coming from his head or if his legs are getting weak.  Sometimes it feels like a lightheadedness but he also feels like he is off balance.  He denies any headache, vision changes, weakness or numbness in the extremities.  There is no chest pain or shortness of breath.  He has elevated blood pressure here but states he is never been told he has high blood pressure.  He is not on any treatment for this.  Home Medications Prior to Admission medications   Medication Sig Start Date End Date Taking? Authorizing Provider  amLODipine (NORVASC) 2.5 MG tablet Take 1 tablet (2.5 mg total) by mouth daily. 08/05/22  Yes Pricilla Loveless, MD  aspirin 81 MG chewable tablet Chew 1 tablet (81 mg total) by mouth daily. 08/05/22  Yes Pricilla Loveless, MD  lansoprazole (PREVACID) 30 MG capsule Take 1 capsule (30 mg total) by mouth daily. 06/06/22 06/06/23  Elson Areas, PA-C      Allergies    Patient has no known allergies.    Review of Systems   Review of Systems  Eyes:  Negative for visual disturbance.  Respiratory:  Negative for shortness of breath.   Cardiovascular:  Negative for chest pain.  Gastrointestinal:  Negative for abdominal pain.  Neurological:  Positive for dizziness and light-headedness. Negative for syncope, weakness, numbness and headaches.    Physical Exam Updated Vital Signs BP (!) 177/65   Pulse  (!) 54   Temp 98.3 F (36.8 C)   Resp 15   Ht 5\' 6"  (1.676 m)   Wt 63.5 kg   SpO2 99%   BMI 22.60 kg/m  Physical Exam Vitals and nursing note reviewed.  Constitutional:      General: He is not in acute distress.    Appearance: He is well-developed. He is not ill-appearing or diaphoretic.  HENT:     Head: Normocephalic and atraumatic.  Eyes:     Extraocular Movements: Extraocular movements intact.     Pupils: Pupils are equal, round, and reactive to light.  Cardiovascular:     Rate and Rhythm: Normal rate and regular rhythm.     Heart sounds: Normal heart sounds.  Pulmonary:     Effort: Pulmonary effort is normal.     Breath sounds: Normal breath sounds.  Abdominal:     Palpations: Abdomen is soft.     Tenderness: There is no abdominal tenderness.  Skin:    General: Skin is warm and dry.  Neurological:     Mental Status: He is alert.     Comments: CN 3-12 grossly intact. 5/5 strength in all 4 extremities. Grossly normal sensation. Normal finger to nose. Able to ambulate. Feels a little dizzy but no ataxia.     ED Results / Procedures / Treatments   Labs (  all labs ordered are listed, but only abnormal results are displayed) Labs Reviewed  BASIC METABOLIC PANEL - Abnormal; Notable for the following components:      Result Value   Glucose, Bld 120 (*)    Calcium 8.5 (*)    All other components within normal limits  CBC WITH DIFFERENTIAL/PLATELET - Abnormal; Notable for the following components:   Platelets 128 (*)    All other components within normal limits    EKG EKG Interpretation  Date/Time:  Tuesday August 05 2022 06:23:55 EDT Ventricular Rate:  54 PR Interval:  179 QRS Duration: 94 QT Interval:  459 QTC Calculation: 435 R Axis:   23 Text Interpretation: Sinus rhythm Anterior infarct, old No significant change since last tracing Confirmed by Zadie Rhine ([id number]) on 08/05/2022 6:29:28 AM  Radiology CT Head Wo Contrast  Result Date:  08/05/2022 CLINICAL DATA:  Neuro deficit, acute, stroke suspected.  Dizziness. EXAM: CT HEAD WITHOUT CONTRAST TECHNIQUE: Contiguous axial images were obtained from the base of the skull through the vertex without intravenous contrast. RADIATION DOSE REDUCTION: This exam was performed according to the departmental dose-optimization program which includes automated exposure control, adjustment of the mA and/or kV according to patient size and/or use of iterative reconstruction technique. 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 There is no evidence of an acute cortically based infarct, intracranial hemorrhage, mass, midline shift, or extra-axial fluid collection. The ventricles and sulci are normal. Vascular: Calcified atherosclerosis at the skull base. No hyperdense vessel. Skull: No fracture or suspicious osseous lesion. 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 Clear mastoid air cells. Unremarkable included orbits. Other: 2 cm low-density subcutaneous nodule in the posterior right upper neck, likely a benign epidermal inclusion cyst. IMPRESSION: 1. 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 2. No evidence of an acute cortically based infarct or intracranial hemorrhage. Electronically Signed   By: Sebastian Ache M.D.   On: 08/05/2022 07:57    Procedures Procedures    Medications Ordered in ED Medications  amLODipine (NORVASC) tablet 5 mg (has no administration in time range)  sodium chloride 0.9 % bolus 1,000 mL (1,000 mLs Intravenous New Bag/Given 08/05/22 0753)    ED Course/ Medical Decision Making/ A&P                           Medical Decision Making Amount and/or Complexity of Data Reviewed Labs: ordered.    Details: No AKI.  Normal WBC and hemoglobin. Radiology: ordered and independent interpretation performed.    Details: CT head without head bleed.  No  mass. ECG/medicine tests: independent interpretation performed.    Details: No arrhythmia or ischemia  Risk OTC drugs. Prescription drug management.   I suspect patient's blood pressure is the primary culprit for his dizziness.  He is able to ambulate here.  My suspicion for acute ischemic stroke is pretty low.  CT head shows no head bleed on my read though radiology does note a lacunar infarct of indeterminate age.  I do not think this represents his symptoms today but certainly he will need outpatient follow-up and work-up.  We will start him on a baby aspirin.  We will start him on antihypertensives.  On chart review it shows that he has been quite hypertensive on multiple ED visits in the past.  We will start on Norvasc and have him follow-up  with PCP.  Otherwise appears stable for discharge home.        Final Clinical Impression(s) / ED Diagnoses Final diagnoses:  Hypertensive urgency    Rx / DC Orders ED Discharge Orders          Ordered    amLODipine (NORVASC) 2.5 MG tablet  Daily        08/05/22 0827    aspirin 81 MG chewable tablet  Daily        08/05/22 0827              Pricilla Loveless, MD 08/05/22 [phone]

## 2022-09-02 ENCOUNTER — Other Ambulatory Visit (HOSPITAL_COMMUNITY): Payer: Self-pay | Admitting: Nurse Practitioner

## 2022-09-02 DIAGNOSIS — Z122 Encounter for screening for malignant neoplasm of respiratory organs: Secondary | ICD-10-CM

## 2022-09-06 ENCOUNTER — Emergency Department (HOSPITAL_COMMUNITY): Payer: Medicare HMO

## 2022-09-06 ENCOUNTER — Other Ambulatory Visit: Payer: Self-pay

## 2022-09-06 ENCOUNTER — Encounter (HOSPITAL_COMMUNITY): Payer: Self-pay

## 2022-09-06 ENCOUNTER — Inpatient Hospital Stay (HOSPITAL_COMMUNITY)
Admission: EM | Admit: 2022-09-06 | Discharge: 2022-09-08 | DRG: 304 | Disposition: A | Discharge status: Home Health | Payer: Medicare HMO | Attending: Family Medicine | Admitting: Family Medicine

## 2022-09-06 DIAGNOSIS — R7303 Prediabetes: Secondary | ICD-10-CM | POA: Diagnosis present

## 2022-09-06 DIAGNOSIS — I161 Hypertensive emergency: Secondary | ICD-10-CM | POA: Diagnosis present

## 2022-09-06 DIAGNOSIS — Z833 Family history of diabetes mellitus: Secondary | ICD-10-CM | POA: Diagnosis not present

## 2022-09-06 DIAGNOSIS — F17210 Nicotine dependence, cigarettes, uncomplicated: Secondary | ICD-10-CM | POA: Diagnosis present

## 2022-09-06 DIAGNOSIS — I6389 Other cerebral infarction: Secondary | ICD-10-CM | POA: Diagnosis present

## 2022-09-06 DIAGNOSIS — R29818 Other symptoms and signs involving the nervous system: Secondary | ICD-10-CM | POA: Diagnosis not present

## 2022-09-06 DIAGNOSIS — E785 Hyperlipidemia, unspecified: Secondary | ICD-10-CM | POA: Diagnosis present

## 2022-09-06 DIAGNOSIS — K219 Gastro-esophageal reflux disease without esophagitis: Secondary | ICD-10-CM | POA: Diagnosis present

## 2022-09-06 DIAGNOSIS — R200 Anesthesia of skin: Secondary | ICD-10-CM

## 2022-09-06 DIAGNOSIS — M5416 Radiculopathy, lumbar region: Secondary | ICD-10-CM | POA: Diagnosis present

## 2022-09-06 DIAGNOSIS — R7989 Other specified abnormal findings of blood chemistry: Secondary | ICD-10-CM | POA: Diagnosis not present

## 2022-09-06 DIAGNOSIS — I10 Essential (primary) hypertension: Secondary | ICD-10-CM | POA: Diagnosis present

## 2022-09-06 DIAGNOSIS — I639 Cerebral infarction, unspecified: Secondary | ICD-10-CM | POA: Diagnosis not present

## 2022-09-06 DIAGNOSIS — Z79899 Other long term (current) drug therapy: Secondary | ICD-10-CM | POA: Diagnosis not present

## 2022-09-06 DIAGNOSIS — F17200 Nicotine dependence, unspecified, uncomplicated: Secondary | ICD-10-CM | POA: Diagnosis present

## 2022-09-06 DIAGNOSIS — F1020 Alcohol dependence, uncomplicated: Secondary | ICD-10-CM | POA: Diagnosis present

## 2022-09-06 DIAGNOSIS — R739 Hyperglycemia, unspecified: Secondary | ICD-10-CM | POA: Diagnosis present

## 2022-09-06 DIAGNOSIS — F1090 Alcohol use, unspecified, uncomplicated: Secondary | ICD-10-CM | POA: Diagnosis not present

## 2022-09-06 DIAGNOSIS — D696 Thrombocytopenia, unspecified: Secondary | ICD-10-CM | POA: Diagnosis present

## 2022-09-06 LAB — CBC WITH DIFFERENTIAL/PLATELET
Abs Immature Granulocytes: 0.03 10*3/uL (ref 0.00–0.07)
Basophils Absolute: 0.1 10*3/uL (ref 0.0–0.1)
Basophils Relative: 1 %
Eosinophils Absolute: 0.5 10*3/uL (ref 0.0–0.5)
Eosinophils Relative: 6 %
HCT: 42.4 % (ref 39.0–52.0)
Hemoglobin: 14.3 g/dL (ref 13.0–17.0)
Immature Granulocytes: 0 %
Lymphocytes Relative: 41 %
Lymphs Abs: 3.2 10*3/uL (ref 0.7–4.0)
MCH: 32.6 pg (ref 26.0–34.0)
MCHC: 33.7 g/dL (ref 30.0–36.0)
MCV: 96.8 fL (ref 80.0–100.0)
Monocytes Absolute: 0.7 10*3/uL (ref 0.1–1.0)
Monocytes Relative: 9 %
Neutro Abs: 3.2 10*3/uL (ref 1.7–7.7)
Neutrophils Relative %: 43 %
Platelets: 131 10*3/uL — ABNORMAL LOW (ref 150–400)
RBC: 4.38 MIL/uL (ref 4.22–5.81)
RDW: 13.5 % (ref 11.5–15.5)
WBC: 7.6 10*3/uL (ref 4.0–10.5)
nRBC: 0 % (ref 0.0–0.2)

## 2022-09-06 LAB — RAPID URINE DRUG SCREEN, HOSP PERFORMED
Amphetamines: NOT DETECTED
Barbiturates: NOT DETECTED
Benzodiazepines: NOT DETECTED
Cocaine: NOT DETECTED
Opiates: NOT DETECTED
Tetrahydrocannabinol: NOT DETECTED

## 2022-09-06 LAB — COMPREHENSIVE METABOLIC PANEL
ALT: 173 U/L — ABNORMAL HIGH (ref 0–44)
AST: 182 U/L — ABNORMAL HIGH (ref 15–41)
Albumin: 3.4 g/dL — ABNORMAL LOW (ref 3.5–5.0)
Alkaline Phosphatase: 93 U/L (ref 38–126)
Anion gap: 4 — ABNORMAL LOW (ref 5–15)
BUN: 21 mg/dL (ref 8–23)
CO2: 24 mmol/L (ref 22–32)
Calcium: 8.6 mg/dL — ABNORMAL LOW (ref 8.9–10.3)
Chloride: 110 mmol/L (ref 98–111)
Creatinine, Ser: 1.09 mg/dL (ref 0.61–1.24)
GFR, Estimated: >60 mL/min (ref >60)
Glucose, Bld: 111 mg/dL — ABNORMAL HIGH (ref 70–99)
Potassium: 3.8 mmol/L (ref 3.5–5.1)
Sodium: 138 mmol/L (ref 135–145)
Total Bilirubin: 0.4 mg/dL (ref 0.3–1.2)
Total Protein: 7.8 g/dL (ref 6.5–8.1)

## 2022-09-06 LAB — URINALYSIS, ROUTINE W REFLEX MICROSCOPIC
Bacteria, UA: NONE SEEN
Bilirubin Urine: NEGATIVE
Glucose, UA: NEGATIVE mg/dL
Ketones, ur: NEGATIVE mg/dL
Leukocytes,Ua: NEGATIVE
Nitrite: NEGATIVE
Protein, ur: NEGATIVE mg/dL
Specific Gravity, Urine: 1.02 (ref 1.005–1.030)
pH: 5 (ref 5.0–8.0)

## 2022-09-06 LAB — PROTIME-INR
INR: 1.2 (ref 0.8–1.2)
Prothrombin Time: 15.2 seconds (ref 11.4–15.2)

## 2022-09-06 LAB — BRAIN NATRIURETIC PEPTIDE: B Natriuretic Peptide: 61 pg/mL (ref 0.0–100.0)

## 2022-09-06 LAB — HEMOGLOBIN A1C
Hgb A1c MFr Bld: 5.8 % — ABNORMAL HIGH (ref 4.8–5.6)
Mean Plasma Glucose: 119.76 mg/dL

## 2022-09-06 LAB — TSH: TSH: 0.672 u[IU]/mL (ref 0.350–4.500)

## 2022-09-06 MED ORDER — ADULT MULTIVITAMIN W/MINERALS CH
1.0000 | ORAL_TABLET | Freq: Every day | ORAL | Status: DC
  Administered 2022-09-06 – 2022-09-08 (×3): 1 via ORAL
  Filled 2022-09-06 (×3): qty 1

## 2022-09-06 MED ORDER — HYDRALAZINE HCL 20 MG/ML IJ SOLN
5.0000 mg | Freq: Once | INTRAMUSCULAR | Status: AC
  Administered 2022-09-06: 5 mg via INTRAVENOUS
  Filled 2022-09-06: qty 1

## 2022-09-06 MED ORDER — FENTANYL CITRATE PF 50 MCG/ML IJ SOSY: 12.5000 ug | PREFILLED_SYRINGE | INTRAMUSCULAR | Status: DC | PRN

## 2022-09-06 MED ORDER — PANTOPRAZOLE SODIUM 40 MG PO TBEC
40.0000 mg | DELAYED_RELEASE_TABLET | Freq: Every day | ORAL | Status: DC
  Administered 2022-09-06 – 2022-09-07 (×2): 40 mg via ORAL
  Filled 2022-09-06 (×2): qty 1

## 2022-09-06 MED ORDER — ASPIRIN 81 MG PO CHEW
81.0000 mg | CHEWABLE_TABLET | Freq: Every day | ORAL | Status: DC
  Administered 2022-09-07 – 2022-09-08 (×2): 81 mg via ORAL
  Filled 2022-09-06 (×2): qty 1

## 2022-09-06 MED ORDER — LORAZEPAM 2 MG/ML IJ SOLN: 0.0000 mg | Freq: Four times a day (QID) | INTRAMUSCULAR | Status: DC

## 2022-09-06 MED ORDER — NICARDIPINE HCL IN NACL 20-0.86 MG/200ML-% IV SOLN
3.0000 mg/h | INTRAVENOUS | Status: DC
  Filled 2022-09-06: qty 200

## 2022-09-06 MED ORDER — LORAZEPAM 2 MG/ML IJ SOLN: 0.0000 mg | Freq: Two times a day (BID) | INTRAMUSCULAR | Status: DC

## 2022-09-06 MED ORDER — ONDANSETRON HCL 4 MG PO TABS: 4.0000 mg | ORAL_TABLET | Freq: Four times a day (QID) | ORAL | Status: DC | PRN

## 2022-09-06 MED ORDER — CHLORHEXIDINE GLUCONATE CLOTH 2 % EX PADS
6.0000 | MEDICATED_PAD | Freq: Every day | CUTANEOUS | Status: DC
  Administered 2022-09-07 – 2022-09-08 (×2): 6 via TOPICAL

## 2022-09-06 MED ORDER — ACETAMINOPHEN 650 MG RE SUPP: 650.0000 mg | Freq: Four times a day (QID) | RECTAL | Status: DC | PRN

## 2022-09-06 MED ORDER — TRAZODONE HCL 50 MG PO TABS: 25.0000 mg | ORAL_TABLET | Freq: Every evening | ORAL | Status: DC | PRN

## 2022-09-06 MED ORDER — ENOXAPARIN SODIUM 40 MG/0.4ML IJ SOSY
40.0000 mg | PREFILLED_SYRINGE | INTRAMUSCULAR | Status: DC
  Administered 2022-09-06 – 2022-09-08 (×3): 40 mg via SUBCUTANEOUS
  Filled 2022-09-06 (×3): qty 0.4

## 2022-09-06 MED ORDER — THIAMINE HCL 100 MG/ML IJ SOLN: 100.0000 mg | Freq: Every day | INTRAMUSCULAR | Status: DC

## 2022-09-06 MED ORDER — ONDANSETRON HCL 4 MG/2ML IJ SOLN: 4.0000 mg | Freq: Four times a day (QID) | INTRAMUSCULAR | Status: DC | PRN

## 2022-09-06 MED ORDER — LORAZEPAM 2 MG/ML IJ SOLN: 1.0000 mg | INTRAMUSCULAR | Status: DC | PRN

## 2022-09-06 MED ORDER — CHLORDIAZEPOXIDE HCL 5 MG PO CAPS
5.0000 mg | ORAL_CAPSULE | Freq: Three times a day (TID) | ORAL | Status: DC
  Administered 2022-09-06 – 2022-09-08 (×7): 5 mg via ORAL
  Filled 2022-09-06 (×7): qty 1

## 2022-09-06 MED ORDER — ACETAMINOPHEN 325 MG PO TABS: 650.0000 mg | ORAL_TABLET | Freq: Four times a day (QID) | ORAL | Status: DC | PRN

## 2022-09-06 MED ORDER — NICARDIPINE HCL IN NACL 20-0.86 MG/200ML-% IV SOLN
3.0000 mg/h | INTRAVENOUS | Status: DC
  Administered 2022-09-06: 5 mg/h via INTRAVENOUS
  Administered 2022-09-06: 2.5 mg/h via INTRAVENOUS
  Administered 2022-09-06: 5 mg/h via INTRAVENOUS
  Filled 2022-09-06 (×2): qty 200

## 2022-09-06 MED ORDER — BISACODYL 5 MG PO TBEC: 5.0000 mg | DELAYED_RELEASE_TABLET | Freq: Every day | ORAL | Status: DC | PRN

## 2022-09-06 MED ORDER — NICOTINE 21 MG/24HR TD PT24
21.0000 mg | MEDICATED_PATCH | Freq: Every day | TRANSDERMAL | Status: DC
  Administered 2022-09-06 – 2022-09-08 (×3): 21 mg via TRANSDERMAL
  Filled 2022-09-06 (×3): qty 1

## 2022-09-06 MED ORDER — FOLIC ACID 1 MG PO TABS
1.0000 mg | ORAL_TABLET | Freq: Every day | ORAL | Status: DC
  Administered 2022-09-06 – 2022-09-08 (×3): 1 mg via ORAL
  Filled 2022-09-06 (×3): qty 1

## 2022-09-06 MED ORDER — THIAMINE MONONITRATE 100 MG PO TABS
100.0000 mg | ORAL_TABLET | Freq: Every day | ORAL | Status: DC
  Administered 2022-09-06 – 2022-09-08 (×3): 100 mg via ORAL
  Filled 2022-09-06 (×3): qty 1

## 2022-09-06 MED ORDER — LORAZEPAM 1 MG PO TABS
1.0000 mg | ORAL_TABLET | ORAL | Status: DC | PRN
  Administered 2022-09-08: 2 mg via ORAL
  Filled 2022-09-06: qty 2

## 2022-09-06 MED ORDER — OXYCODONE HCL 5 MG PO TABS: 5.0000 mg | ORAL_TABLET | ORAL | Status: DC | PRN

## 2022-09-06 NOTE — Assessment & Plan Note (Addendum)
-   Consistently with symptoms of left-sided numbness involving the upper and lower extremity.  Mild loss of power strength on the left side. Symptoms persist  -CT head reassuring of no hemorrhagic CVA: Pt refused to get repeat CT 10/1 and threatening to leave AMA.  -consult teleneurology 10/2 (not available on weekends at [facility])

## 2022-09-06 NOTE — Assessment & Plan Note (Signed)
-   See above plan and recommendation

## 2022-09-06 NOTE — Assessment & Plan Note (Signed)
-   Monitor for bleeding complications, careful with heparins

## 2022-09-06 NOTE — Assessment & Plan Note (Signed)
-   Nicotine patch ordered and counseled on smoking cessation, he reports he has been smoking since age 66 and not ready to quit

## 2022-09-06 NOTE — Assessment & Plan Note (Addendum)
-   At least 6 pack/day of beer -Patient admits to daily alcohol drinking -CIWA protocol and added librium 5 mg TID, counseled on cessation

## 2022-09-06 NOTE — Hospital Course (Signed)
66 year old gentleman with recently diagnosed hypertension, daily alcohol consumer, smoker, GERD presents to the ED complaining of left-sided numbness since yesterday morning.  He reportedly was recently diagnosed with hypertension and started on amlodipine 2.5 mg daily from an ED visit on August 05, 2022.  He says he has not been taking it regularly.  He reported that yesterday morning after a shower he started to feel the symptoms of left-sided numbness.  The symptoms have persisted prompting his visit.  He denies chest pain.  He denies shortness of breath.  He denies vision changes.  He denies rash.  He reportedly has been smoking cigarettes since age 57.  His blood pressure was notably elevated on arrival with a SBP up to 217.  He had a CT brain with no acute findings.  Neurology was consulted and felt that patient can safely stay at [facility].  MRI not recommended at this time.  Treating hypertensive emergency is recommendation for now.  If he were to develop worsening symptoms he will need an MRI of the brain.  Patient is being admitted for further management.

## 2022-09-06 NOTE — ED Triage Notes (Signed)
Patient states that he took shower yesterday and felt the left side of his body go numb at 0600. States that his hand and side are numb at this time. Alert and oriented. Able to ambulate, states that he is having no other neuro deficits. NIH 0 in triage.

## 2022-09-06 NOTE — Assessment & Plan Note (Signed)
-   Likely due to chronic alcoholism - Abdominal ultrasound reassuring

## 2022-09-06 NOTE — H&P (Addendum)
History and Physical  [facility]  [place] IZT:[id number] DOB: [birth year] DOA: 09/06/2022  PCP: Gareth Morgan, MD  Patient coming from: Home  Level of care: Stepdown  I have personally briefly reviewed patient's old medical records in [facility]  Chief Complaint: numbness left sided   HPI: Randy Ashley is a 66 year old gentleman with recently diagnosed hypertension, daily alcohol consumer, smoker, GERD presents to the ED complaining of left-sided numbness since yesterday morning.  He reportedly was recently diagnosed with hypertension and started on amlodipine 2.5 mg daily from an ED visit on August 05, 2022.  He says he has not been taking it regularly.  He reported that yesterday morning after a shower he started to feel the symptoms of left-sided numbness.  The symptoms have persisted prompting his visit.  He denies chest pain.  He denies shortness of breath.  He denies vision changes.  He denies rash.  He reportedly has been smoking cigarettes since age 24.  His blood pressure was notably elevated on arrival with a SBP up to 217.  He had a CT brain with no acute findings.  Neurology was consulted and felt that patient can safely stay at [facility].  MRI not recommended at this time.  Treating hypertensive emergency is recommendation for now.  If he were to develop worsening symptoms he will need an MRI of the brain.  Patient is being admitted for further management.   Past Medical History:  Diagnosis Date   Chronic back pain    Hypertension    Lumbar radiculopathy     Past Surgical History:  Procedure Laterality Date   HERNIA REPAIR       reports that he has been smoking cigarettes. He has been smoking an average of 1 pack per day. He has never used smokeless tobacco. He reports current alcohol use. He reports that he does not use drugs.  No Known Allergies  Family History  Problem Relation Age of Onset   Diabetes Other        Family History    Heart  disease Other        Family History    Arthritis Other        Family History     Prior to Admission medications   Medication Sig Start Date End Date Taking? Authorizing Provider  amLODipine (NORVASC) 2.5 MG tablet Take 1 tablet (2.5 mg total) by mouth daily. 08/05/22   Pricilla Loveless, MD  aspirin 81 MG chewable tablet Chew 1 tablet (81 mg total) by mouth daily. 08/05/22   Pricilla Loveless, MD  lansoprazole (PREVACID) 30 MG capsule Take 1 capsule (30 mg total) by mouth daily. 06/06/22 06/06/23  Elson Areas, PA-C    Physical Exam: Vitals:   09/06/22 1345 09/06/22 1350 09/06/22 1400 09/06/22 1404  BP: (!) 217/81 (!) 206/74 (!) 189/88   Pulse:      Resp: 19 15 18    Temp:    98.6 F (37 C)  TempSrc:    Oral  SpO2:      Weight:      Height:        Constitutional: frail, emaciated male, awake, oriented, NAD, calm, comfortable Eyes: PERRL, lids and conjunctivae normal ENMT: Mucous membranes are moist. Posterior pharynx clear of any exudate or lesions.Normal dentition.  Neck: normal, supple, no masses, no thyromegaly Respiratory: clear to auscultation bilaterally, no wheezing, no crackles. Normal respiratory effort. No accessory muscle use.  Cardiovascular: normal s1, s2 sounds, no murmurs /  rubs / gallops. No extremity edema. 2+ pedal pulses. No carotid bruits.  Abdomen: no tenderness, no masses palpated. No hepatosplenomegaly. Bowel sounds positive.  Musculoskeletal: no clubbing / cyanosis. No joint deformity upper and lower extremities. Good ROM, no contractures. Normal muscle tone.  Skin: no rashes, lesions, ulcers. No induration Neurologic: CN 2-12 grossly intact. Sensation reduced LUE/LLE, DTR normal. Strength 5/5 in all 4.  Psychiatric: poor judgment and insight. Alert and oriented x 3. Normal mood.   Labs on Admission: I have personally reviewed following labs and imaging studies  CBC: Recent Labs  Lab 09/06/22 1000  WBC 7.6  NEUTROABS 3.2  HGB 14.3  HCT 42.4  MCV 96.8   PLT 131*   Basic Metabolic Panel: Recent Labs  Lab 09/06/22 1000  NA 138  K 3.8  CL 110  CO2 24  GLUCOSE 111*  BUN 21  CREATININE 1.09  CALCIUM 8.6*   GFR: Estimated Creatinine Clearance: 60.7 mL/min (by C-G formula based on SCr of 1.09 mg/dL). Liver Function Tests: Recent Labs  Lab 09/06/22 1000  AST 182*  ALT 173*  ALKPHOS 93  BILITOT 0.4  PROT 7.8  ALBUMIN 3.4*   No results for input(s): "LIPASE", "AMYLASE" in the last 168 hours. No results for input(s): "AMMONIA" in the last 168 hours. Coagulation Profile: Recent Labs  Lab 09/06/22 1000  INR 1.2   Cardiac Enzymes: No results for input(s): "CKTOTAL", "CKMB", "CKMBINDEX", "TROPONINI" in the last 168 hours. BNP (last 3 results) No results for input(s): "PROBNP" in the last 8760 hours. HbA1C: No results for input(s): "HGBA1C" in the last 72 hours. CBG: No results for input(s): "GLUCAP" in the last 168 hours. Lipid Profile: No results for input(s): "CHOL", "HDL", "LDLCALC", "TRIG", "CHOLHDL", "LDLDIRECT" in the last 72 hours. Thyroid Function Tests: Recent Labs    09/06/22 1000  TSH 0.672   Anemia Panel: No results for input(s): "VITAMINB12", "FOLATE", "FERRITIN", "TIBC", "IRON", "RETICCTPCT" in the last 72 hours. Urine analysis:    Component Value Date/Time   COLORURINE YELLOW 09/06/2022 1342   APPEARANCEUR CLEAR 09/06/2022 1342   LABSPEC 1.020 09/06/2022 1342   PHURINE 5.0 09/06/2022 1342   GLUCOSEU NEGATIVE 09/06/2022 1342   HGBUR SMALL (A) 09/06/2022 1342   BILIRUBINUR NEGATIVE 09/06/2022 1342   KETONESUR NEGATIVE 09/06/2022 1342   PROTEINUR NEGATIVE 09/06/2022 1342   UROBILINOGEN 0.2 09/11/2015 1630   NITRITE NEGATIVE 09/06/2022 1342   LEUKOCYTESUR NEGATIVE 09/06/2022 1342    Radiological Exams on Admission: US Abdomen Limited  Result Date: 09/06/2022 CLINICAL DATA:  Elevated LFTs EXAM: ULTRASOUND ABDOMEN LIMITED RIGHT UPPER QUADRANT COMPARISON:  None Available. FINDINGS: Gallbladder: No  gallstones or wall thickening visualized. No sonographic Murphy sign noted by sonographer. Common bile duct: Diameter: 3.0 Liver: No focal lesion identified. Within normal limits in parenchymal echogenicity. Portal vein is patent on color Doppler imaging with normal direction of blood flow towards the liver. Other: None. IMPRESSION: Normal RIGHT upper quadrant ultrasound. Normal liver by ultrasound evaluation Electronically Signed   By: Genevive Bi M.D.   On: 09/06/2022 11:26   CT Head Wo Contrast  Result Date: 09/06/2022 CLINICAL DATA:  Patient reported taking a shower yesterday when he began to have left-sided body numbness. Left hand and side are numb at this time. EXAM: CT HEAD WITHOUT CONTRAST TECHNIQUE: Contiguous axial images were obtained from the base of the skull through the vertex without intravenous contrast. RADIATION DOSE REDUCTION: This exam was performed according to the departmental dose-optimization program which includes automated exposure control,  adjustment of the mA and/or kV according to patient size and/or use of iterative reconstruction technique. COMPARISON:  08/05/2022. FINDINGS: Brain: No evidence of acute infarction, hemorrhage, hydrocephalus, extra-axial collection or mass lesion/mass effect. 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 Vascular: No hyperdense vessel or unexpected calcification. Skull: Normal. Negative for fracture or focal lesion. Sinuses/Orbits: Globes and orbits are unremarkable. Visualized sinuses are clear. Other: None. IMPRESSION: 1. No acute intracranial abnormalities. Electronically Signed   By: Lajean Manes M.D.   On: 09/06/2022 10:48    EKG: Independently reviewed.   Assessment/Plan Principal Problem:   Hypertensive emergency Active Problems:   Acute focal neurological deficit   Heavy alcohol consumption   Uncontrolled hypertension   Smoker   Elevated LFTs   Thrombocytopenia (HCC)    Hyperglycemia   GERD (gastroesophageal reflux disease)   Assessment and Plan: * Hypertensive emergency - As evidenced by acute neurological changes - Goal is to reduce BP by 25% in the first hour and then over the next 2 to 6 hours try to get goal to 160 SBP -Starting nicardipine infusion which is titratable to goals and monitor in stepdown ICU  Heavy alcohol consumption - At least 6 pack/day of beer -Patient admits to daily alcohol drinking -CIWA protocol  Acute focal neurological deficit - Consistently with symptoms of left-sided numbness involving the upper and lower extremity.  Mild loss of power strength on the left side. -CT head reassuring of no hemorrhagic CVA -Continue neurochecks  GERD (gastroesophageal reflux disease) - Protonix for GI protection  Thrombocytopenia (HCC) - Monitor for bleeding complications, careful with heparins  Elevated LFTs - Likely due to chronic alcoholism - Abdominal ultrasound reassuring  Smoker - Nicotine patch ordered and counseled on smoking cessation, he reports he has been smoking since age 33 and not ready to quit  Uncontrolled hypertension - See above plan and recommendation   Critical Care Procedure Note Authorized and Performed by: Murvin Natal MD  Total Critical Care time:  55 mins Due to a high probability of clinically significant, life threatening deterioration, the patient required my highest level of preparedness to intervene emergently and I personally spent this critical care time directly and personally managing the patient.  This critical care time included obtaining a history; examining the patient, pulse oximetry; ordering and review of studies; arranging urgent treatment with development of a management plan; evaluation of patient's response of treatment; frequent reassessment; and discussions with other providers.  This critical care time was performed to assess and manage the high probability of imminent and life  threatening deterioration that could result in multi-organ failure.  It was exclusive of separately billable procedures and treating other patients and teaching time.    DVT prophylaxis: enoxaparin   Code Status: Full   Family Communication:   Disposition Plan: anticipate home   Consults called:   Admission status: INP  Level of care: Stepdown Irwin Brakeman MD [facility] How to contact the [facility] Attending or Consulting provider [place] or covering provider during after hours [place], for this patient?  Check the care team in [facility] and look for a) attending/consulting [facility] provider listed and b) the [facility] team listed Log into [email or web address] and use [place]'s universal password to access. If you do not have the password, please contact the hospital operator. Locate the [facility] provider you are looking for under [facility] and page to a number that you can be directly reached. If you still  have difficulty reaching the provider, please page the [facility] (Director on Call) for the Hospitalists listed on amion for assistance.   If 7PM-7AM, please contact night-coverage [email or web address] Password [email or web address]  09/06/2022, 2:36 PM

## 2022-09-06 NOTE — Assessment & Plan Note (Signed)
-   Protonix for GI protection 

## 2022-09-06 NOTE — ED Provider Notes (Signed)
[facility] EMERGENCY DEPARTMENT Provider Note   CSN: [id number] Arrival date & time: 09/06/22  6010     History  Chief Complaint  Patient presents with   Numbness    Randy Ashley is a 66 y.o. male.  HPI   Patient with medical history including hypertension presents with complaints of left-sided numbness, states it started yesterday, states it happened after he took a shower around 6 AM, he states that when he woke up he had no numbness but after the shower felt numbness just on the left side, he states he just feels like his left side is asleep, no associated weakness, no difficulty with coordination, he denies any headaches or change in vision no lightheaded or dizziness no feeling off balance.  No recent head trauma, not anticoag's, he was recent diagnosed with hypertension and started amlodipine, states he is been smoking cigarettes since he was 66 years old, no history of diabetes, hyperlipidemia, no history of strokes or cardiac abnormalities.    Home Medications Prior to Admission medications   Medication Sig Start Date End Date Taking? Authorizing Provider  amLODipine (NORVASC) 2.5 MG tablet Take 1 tablet (2.5 mg total) by mouth daily. 08/05/22   Pricilla Loveless, MD  aspirin 81 MG chewable tablet Chew 1 tablet (81 mg total) by mouth daily. 08/05/22   Pricilla Loveless, MD  lansoprazole (PREVACID) 30 MG capsule Take 1 capsule (30 mg total) by mouth daily. 06/06/22 06/06/23  Elson Areas, PA-C      Allergies    Patient has no known allergies.    Review of Systems   Review of Systems  Constitutional:  Negative for chills and fever.  Respiratory:  Negative for shortness of breath.   Cardiovascular:  Negative for chest pain.  Gastrointestinal:  Negative for abdominal pain.  Neurological:  Positive for numbness. Negative for headaches.    Physical Exam Updated Vital Signs BP (!) 214/83   Pulse (!) 49   Temp 97.7 F (36.5 C) (Oral)   Resp 19   Ht 5\' 6"  (1.676 m)    Wt 63.5 kg   SpO2 98%   BMI 22.60 kg/m  Physical Exam Vitals and nursing note reviewed.  Constitutional:      General: He is not in acute distress.    Appearance: He is not ill-appearing.  HENT:     Head: Normocephalic and atraumatic.     Nose: No congestion.  Eyes:     Extraocular Movements: Extraocular movements intact.     Conjunctiva/sclera: Conjunctivae normal.     Pupils: Pupils are equal, round, and reactive to light.  Cardiovascular:     Rate and Rhythm: Normal rate and regular rhythm.     Pulses: Normal pulses.     Heart sounds: No murmur heard.    No friction rub. No gallop.  Pulmonary:     Effort: No respiratory distress.     Breath sounds: No wheezing, rhonchi or rales.  Skin:    General: Skin is warm and dry.  Neurological:     Mental Status: He is alert.     GCS: GCS eye subscore is 4. GCS verbal subscore is 5. GCS motor subscore is 6.     Cranial Nerves: Cranial nerves 2-12 are intact.     Sensory: Sensory deficit present.     Motor: No weakness.     Coordination: Coordination is intact. Romberg sign negative. Finger-Nose-Finger Test normal.     Gait: Gait is intact.     Comments:  Cranial nerves II through 12 grossly intact no difficulty with word finding, following two-step commands unilateral weakness present.  Patient is noted decrease in sensation on the left side, including his upper and lower extremities as well as his face.  There is no gait disturbances.               Psychiatric:        Mood and Affect: Mood normal.     ED Results / Procedures / Treatments   Labs (all labs ordered are listed, but only abnormal results are displayed) Labs Reviewed  CBC WITH DIFFERENTIAL/PLATELET - Abnormal; Notable for the following components:      Result Value   Platelets 131 (*)    All other components within normal limits  COMPREHENSIVE METABOLIC PANEL - Abnormal; Notable for the following components:   Glucose, Bld 111 (*)    Calcium 8.6 (*)    Albumin  3.4 (*)    AST 182 (*)    ALT 173 (*)    Anion gap 4 (*)    All other components within normal limits  PROTIME-INR    EKG None  Radiology US Abdomen Limited  Result Date: 09/06/2022 CLINICAL DATA:  Elevated LFTs EXAM: ULTRASOUND ABDOMEN LIMITED RIGHT UPPER QUADRANT COMPARISON:  None Available. FINDINGS: Gallbladder: No gallstones or wall thickening visualized. No sonographic Murphy sign noted by sonographer. Common bile duct: Diameter: 3.0 Liver: No focal lesion identified. Within normal limits in parenchymal echogenicity. Portal vein is patent on color Doppler imaging with normal direction of blood flow towards the liver. Other: None. IMPRESSION: Normal RIGHT upper quadrant ultrasound. Normal liver by ultrasound evaluation Electronically Signed   By: Genevive Bi M.D.   On: 09/06/2022 11:26   CT Head Wo Contrast  Result Date: 09/06/2022 CLINICAL DATA:  Patient reported taking a shower yesterday when he began to have left-sided body numbness. Left hand and side are numb at this time. EXAM: CT HEAD WITHOUT CONTRAST TECHNIQUE: Contiguous axial images were obtained from the base of the skull through the vertex without intravenous contrast. RADIATION DOSE REDUCTION: This exam was performed according to the departmental dose-optimization program which includes automated exposure control, adjustment of the mA and/or kV according to patient size and/or use of iterative reconstruction technique. COMPARISON:  08/05/2022. FINDINGS: Brain: No evidence of acute infarction, hemorrhage, hydrocephalus, extra-axial collection or mass lesion/mass effect. 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 Vascular: No hyperdense vessel or unexpected calcification. Skull: Normal. Negative for fracture or focal lesion. Sinuses/Orbits: Globes and orbits are unremarkable. Visualized sinuses are clear. Other: None. IMPRESSION: 1. No acute intracranial abnormalities. Electronically  Signed   By: Amie Portland M.D.   On: 09/06/2022 10:48    Procedures Procedures    Medications Ordered in ED Medications  nicardipine (CARDENE) 20mg  in 0.86% saline IV infusion (0.1 mg/ml) (has no administration in time range)  hydrALAZINE (APRESOLINE) injection 5 mg (5 mg Intravenous Given 09/06/22 1249)    ED Course/ Medical Decision Making/ A&P                           Medical Decision Making Amount and/or Complexity of Data Reviewed Labs: ordered. Radiology: ordered.  Risk Prescription drug management. Decision regarding hospitalization.   This patient presents to the ED for concern of paresthesias, this involves an extensive number of treatment options, and is a complaint that carries with it a high risk of  complications and morbidity.  The differential diagnosis includes TIA, CVA, spinal cord stenosis,    Additional history obtained:  Additional history obtained from N/A External records from outside source obtained and reviewed including previous ER notes   Co morbidities that complicate the patient evaluation  HTN, current tobacco user  Social Determinants of Health:  N/A    Lab Tests:  I Ordered, and personally interpreted labs.  The pertinent results include: CBC unremarkable, CMP reveals glucose of 111, calcium 8.6, albumin 3.4, AST 182 ALT 173 and 4, prothrombin time INR unremarkable   Imaging Studies ordered:  I ordered imaging studies including  CT head I independently visualized and interpreted imaging which showed he had negative acute findings, ultrasound negative acute findings I agree with the radiologist interpretation   Cardiac Monitoring:  The patient was maintained on a cardiac monitor.  I personally viewed and interpreted the cardiac monitored which showed an underlying rhythm of: sinus bradycardia   Medicines ordered and prescription drug management:  I ordered medication including hydralazine I have reviewed the patients  home medicines and have made adjustments as needed  Critical Interventions:  N/A   Reevaluation:  Presents with paresthesias starting yesterday, patient is outside a stroke window, he has noted paresthesia on the left side, concern for possible CVA, will obtain lab work imaging and reassess  Liver enzymes are slightly abnormal, appears she has had this in the past, he has no right upper quadrant tenderness, he is nonjaundiced on my exam, patient endorses is that he does drink alcohol frequently no history of alcohol withdrawal, will obtain ultrasound for rule out of biliary/hepatic abnormality.  Imaging was unremarkable consult with neurology for further recommendations of paresthesias.  Woke with patient regarding recommendation from neurology agreement this plan will admit to medicine    Consultations Obtained:  I requested consultation with the neurologist Dr. Livia Snellen and discussed lab and imaging findings as well as pertinent plan - they recommend: Can remain at [facility], recommend treating for hypertensive emergency, if symptoms do not resolve after normalizing BP would consider following up with MRI. Spoke with Dr. Wynetta Emery will admit the patient.    Test Considered:  N/A    Rule out low suspicion for internal head bleed and or mass as CT imaging is negative for acute findings.  Low suspicion for CVA he has no focal deficit present my exam, he does have subjective paresthesias on the left side, possible this could be hypertensive emergency versus small stroke, patient will need further observation.  Low suspicion for dissection of the vertebral or carotid artery as presentation atypical of etiology.  Low suspicion for meningitis as she has no meningeal sign present.  I have low suspicion for hepatic or biliary abnormality as he has no right upper quadrant tenderness nonjaundiced on appearance, ultrasound is negative for acute findings, does have elevation liver enzymes he has  had this in the past, likely transient suspect from alcohol consumption.     Dispostion and problem list  After consideration of the diagnostic results and the patients response to treatment, I feel that the patent would benefit from admission  Hypertensive emergency-poorly controlled hypertension, patient will need further observation and management of his hypertension. Paresthesias-unclear etiology suspect likely from hypertensive emergency but if no improvement after adequate control of blood BP would recommend MRI reconsulting neurology.           Final Clinical Impression(s) / ED Diagnoses Final diagnoses:  Hypertensive emergency  Left sided numbness  Rx / DC Orders ED Discharge Orders     None         Barnie Del 09/06/22 1308    Bethann Berkshire, MD 09/11/22 [phone]

## 2022-09-06 NOTE — Assessment & Plan Note (Addendum)
-   As evidenced by acute neurological changes - Goal is to reduce BP by 25% in the first hour and then over the next 2 to 6 hours try to get goal to 160 SBP -Started nicardipine infusion which is titratable to goals and monitor in stepdown ICU -- Now off IV nicardipine and on losartan 50 mg daily, amlodipine 10 mg

## 2022-09-07 DIAGNOSIS — I161 Hypertensive emergency: Secondary | ICD-10-CM | POA: Diagnosis not present

## 2022-09-07 DIAGNOSIS — R29818 Other symptoms and signs involving the nervous system: Secondary | ICD-10-CM | POA: Diagnosis not present

## 2022-09-07 DIAGNOSIS — R7989 Other specified abnormal findings of blood chemistry: Secondary | ICD-10-CM | POA: Diagnosis not present

## 2022-09-07 DIAGNOSIS — K219 Gastro-esophageal reflux disease without esophagitis: Secondary | ICD-10-CM | POA: Diagnosis not present

## 2022-09-07 LAB — MRSA NEXT GEN BY PCR, NASAL: MRSA by PCR Next Gen: NOT DETECTED

## 2022-09-07 LAB — MAGNESIUM: Magnesium: 1.9 mg/dL (ref 1.7–2.4)

## 2022-09-07 LAB — HIV ANTIBODY (ROUTINE TESTING W REFLEX): HIV Screen 4th Generation wRfx: NONREACTIVE

## 2022-09-07 MED ORDER — AMLODIPINE BESYLATE 5 MG PO TABS
5.0000 mg | ORAL_TABLET | Freq: Every day | ORAL | Status: DC
  Administered 2022-09-07 – 2022-09-08 (×2): 5 mg via ORAL
  Filled 2022-09-07 (×2): qty 1

## 2022-09-07 MED ORDER — LOSARTAN POTASSIUM 25 MG PO TABS
25.0000 mg | ORAL_TABLET | Freq: Every day | ORAL | Status: DC
  Administered 2022-09-07 – 2022-09-08 (×2): 25 mg via ORAL
  Filled 2022-09-07 (×2): qty 1

## 2022-09-07 NOTE — Progress Notes (Signed)
PROGRESS NOTE   Randy Ashley  JIR:[id number] DOB: 1956-08-06 DOA: 09/06/2022 PCP: Lemmie Evens, MD   Chief Complaint  Patient presents with   Numbness   Level of care: Telemetry  Brief Admission History:  66 year old gentleman with recently diagnosed hypertension, daily alcohol consumer, smoker, GERD presents to the ED complaining of left-sided numbness since yesterday morning.  He reportedly was recently diagnosed with hypertension and started on amlodipine 2.5 mg daily from an ED visit on August 05, 2022.  He says he has not been taking it regularly.  He reported that yesterday morning after a shower he started to feel the symptoms of left-sided numbness.  The symptoms have persisted prompting his visit.  He denies chest pain.  He denies shortness of breath.  He denies vision changes.  He denies rash.  He reportedly has been smoking cigarettes since age 58.  His blood pressure was notably elevated on arrival with a SBP up to 217.  He had a CT brain with no acute findings.  Neurology was consulted and felt that patient can safely stay at [facility].  MRI not recommended at this time.  Treating hypertensive emergency is recommendation for now.  If he were to develop worsening symptoms he will need an MRI of the brain.  Patient is being admitted for further management.   Assessment and Plan: * Hypertensive emergency - As evidenced by acute neurological changes - Goal is to reduce BP by 25% in the first hour and then over the next 2 to 6 hours try to get goal to 160 SBP -Started nicardipine infusion which is titratable to goals and monitor in stepdown ICU -- Now off IV nicardipine and on losartan 25 mg daily, amlodipine 5 mg   Heavy alcohol consumption - At least 6 pack/day of beer -Patient admits to daily alcohol drinking -CIWA protocol and added librium 5 mg TID    Acute focal neurological deficit - Consistently with symptoms of left-sided numbness involving the upper and lower  extremity.  Mild loss of power strength on the left side. Symptoms persist  -CT head reassuring of no hemorrhagic CVA: Pt refused to get repeat CT 10/1 and threatening to leave AMA.  -consult teleneurology 10/2 (not available on weekends at [facility])  GERD (gastroesophageal reflux disease) - Protonix for GI protection  Hyperglycemia --A1c 5.8% c/w prediabetes mellitus   Thrombocytopenia (HCC) - Monitor for bleeding complications, careful with heparins  Elevated LFTs - Likely due to chronic alcoholism - Abdominal ultrasound reassuring  Smoker - Nicotine patch ordered and counseled on smoking cessation, he reports he has been smoking since age 89 and not ready to quit  Uncontrolled hypertension - See above plan and recommendation   DVT prophylaxis: enoxaparin  Code Status: Full  Family Communication:  Disposition: Status is: Inpatient Remains inpatient appropriate because: intensity    Consultants:   Procedures:   Antimicrobials:    Subjective: Pt reports that he is going to leave, he still has weakness in left leg and difficulty with ambulating, willing to work with PT, refusing repeat CT scan today Objective: Vitals:   09/07/22 0930 09/07/22 1030 09/07/22 1100 09/07/22 1130  BP: (!) 139/94 (!) 151/85 (!) 143/61 130/64  Pulse:      Resp:      Temp:      TempSrc:      SpO2:      Weight:      Height:        Intake/Output Summary (Last 24 hours) at 09/07/2022  [place] filed at 09/07/2022 0957 Gross per 24 hour  Intake 477.14 ml  Output 800 ml  Net -322.86 ml   Filed Weights   09/06/22 0902 09/06/22 1431 09/07/22 0300  Weight: 63.5 kg 63.6 kg 67 kg   Examination:  General exam: Appears calm and comfortable  Respiratory system: Clear to auscultation. Respiratory effort normal. Cardiovascular system: normal S1 & S2 heard. No JVD, murmurs, rubs, gallops or clicks. No pedal edema. Gastrointestinal system: Abdomen is nondistended, soft and nontender. No  organomegaly or masses felt. Normal bowel sounds heard. Central nervous system: Alert and oriented. No focal neurological deficits. Extremities: Symmetric 5 x 5 power. Skin: No rashes, lesions or ulcers. Psychiatry: Judgement and insight appear normal. Mood & affect appropriate.   Data Reviewed: I have personally reviewed following labs and imaging studies  CBC: Recent Labs  Lab 09/06/22 1000  WBC 7.6  NEUTROABS 3.2  HGB 14.3  HCT 42.4  MCV 96.8  PLT 131*    Basic Metabolic Panel: Recent Labs  Lab 09/06/22 1000 09/07/22 0451  NA 138  --   K 3.8  --   CL 110  --   CO2 24  --   GLUCOSE 111*  --   BUN 21  --   CREATININE 1.09  --   CALCIUM 8.6*  --   MG  --  1.9    CBG: No results for input(s): "GLUCAP" in the last 168 hours.  Recent Results (from the past 240 hour(s))  MRSA Next Gen by PCR, Nasal     Status: None   Collection Time: 09/06/22  4:19 PM   Specimen: Nasal Mucosa; Nasal Swab  Result Value Ref Range Status   MRSA by PCR Next Gen NOT DETECTED NOT DETECTED Final    Comment: (NOTE) The GeneXpert MRSA Assay (FDA approved for NASAL specimens only), is one component of a comprehensive MRSA colonization surveillance program. It is not intended to diagnose MRSA infection nor to guide or monitor treatment for MRSA infections. Test performance is not FDA approved in patients less than 78 years old. Performed at [facility], [address], [place], [place] [address]      Radiology Studies: US Abdomen Limited  Result Date: 09/06/2022 CLINICAL DATA:  Elevated LFTs EXAM: ULTRASOUND ABDOMEN LIMITED RIGHT UPPER QUADRANT COMPARISON:  None Available. FINDINGS: Gallbladder: No gallstones or wall thickening visualized. No sonographic Murphy sign noted by sonographer. Common bile duct: Diameter: 3.0 Liver: No focal lesion identified. Within normal limits in parenchymal echogenicity. Portal vein is patent on color Doppler imaging with normal direction of blood flow  towards the liver. Other: None. IMPRESSION: Normal RIGHT upper quadrant ultrasound. Normal liver by ultrasound evaluation Electronically Signed   By: Suzy Bouchard M.D.   On: 09/06/2022 11:26   CT Head Wo Contrast  Result Date: 09/06/2022 CLINICAL DATA:  Patient reported taking a shower yesterday when he began to have left-sided body numbness. Left hand and side are numb at this time. EXAM: CT HEAD WITHOUT CONTRAST TECHNIQUE: Contiguous axial images were obtained from the base of the skull through the vertex without intravenous contrast. RADIATION DOSE REDUCTION: This exam was performed according to the departmental dose-optimization program which includes automated exposure control, adjustment of the mA and/or kV according to patient size and/or use of iterative reconstruction technique. COMPARISON:  08/05/2022. FINDINGS: Brain: No evidence of acute infarction, hemorrhage, hydrocephalus, extra-axial collection or mass lesion/mass effect. 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 Vascular: No hyperdense vessel or unexpected calcification. Skull: Normal. Negative for fracture or focal lesion. Sinuses/Orbits: Globes and orbits are unremarkable. Visualized sinuses are clear. Other: None. IMPRESSION: 1. No acute intracranial abnormalities. Electronically Signed   By: Lajean Manes M.D.   On: 09/06/2022 10:48    Scheduled Meds:  amLODipine  5 mg Oral Daily   aspirin  81 mg Oral Daily   chlordiazePOXIDE  5 mg Oral TID   Chlorhexidine Gluconate Cloth  6 each Topical Q0600   enoxaparin (LOVENOX) injection  40 mg Subcutaneous [id number]   folic acid  1 mg Oral Daily   losartan  25 mg Oral Daily   multivitamin with minerals  1 tablet Oral Daily   nicotine  21 mg Transdermal Daily   pantoprazole  40 mg Oral QHS   thiamine  100 mg Oral Daily   Or   thiamine  100 mg Intravenous Daily   Continuous Infusions:   LOS: 1 day   Time spent: 36 mins  Celester Morgan Wynetta Emery,  MD How to contact the [facility] Attending or Consulting provider [place] or covering provider during after hours [place], for this patient?  Check the care team in [facility] and look for a) attending/consulting TRH provider listed and b) the [facility] team listed Log into [email or web address] and use [place]'s universal password to access. If you do not have the password, please contact the hospital operator. Locate the [facility] provider you are looking for under [facility] and page to a number that you can be directly reached. If you still have difficulty reaching the provider, please page the [facility] (Director on Call) for the Hospitalists listed on amion for assistance.  09/07/2022, 1:07 PM

## 2022-09-07 NOTE — Assessment & Plan Note (Signed)
--  A1c 5.8% c/w prediabetes mellitus

## 2022-09-07 NOTE — Progress Notes (Signed)
Patient pulled out his peripheral IV. Pulled off his cardiac monitor. And states he is going to leave. Patient still complaining of left leg numbness and cramping, this RN offered mustard and patient accepted to help with cramping. Patient agreeable to wear blood pressure cuff since patient is here with blood pressure issues. States he doesn't want to wait until Monday for a MRI and doesn't want to do another CT scan. Will make Dr. Wynetta Emery aware. Currently physical therapy is at bedside.

## 2022-09-07 NOTE — Evaluation (Signed)
Physical Therapy Evaluation Patient Details Name: Randy Ashley MRN: [id number] DOB: 20-Jun-1956 Today's Date: 09/07/2022  History of Present Illness  Randy Ashley is a 66 year old gentleman with recently diagnosed hypertension, daily alcohol consumer, smoker, GERD presents to the ED complaining of left-sided numbness since yesterday morning.  He reportedly was recently diagnosed with hypertension and started on amlodipine 2.5 mg daily from an ED visit on August 05, 2022.  He says he has not been taking it regularly.  He reported that yesterday morning after a shower he started to feel the symptoms of left-sided numbness.  The symptoms have persisted prompting his visit.  He denies chest pain.  He denies shortness of breath.  He denies vision changes.  He denies rash.  He reportedly has been smoking cigarettes since age 75.  His blood pressure was notably elevated on arrival with a SBP up to 217.  He had a CT brain with no acute findings.  Neurology was consulted and felt that patient can safely stay at [facility].  MRI not recommended at this time.  Treating hypertensive emergency is recommendation for now.  If he were to develop worsening symptoms he will need an MRI of the brain.  Patient is being admitted for further management.   Clinical Impression  Patient with strength intact, 5/5 LE MMT bilateral, but decreased light touch on L at L4, L5, S1. He does not require assist for bed mobility and demonstrates good sitting balance at EOB. He transfers to standing without assist and uses RW for balance upon standing. He demonstrates good static balance with RW. Patient ambulates with intermittent ataxic gait on LLE and is given frequent cueing for proper RW use with fair carry over. Patient returned back to bed at end of session. Patient stating he would likely be able to have assist from ex-wife upon return home. Patient would benefit from use of RW upon return home in order to improve balance and  reduce the risk for falls. Patient discharged to care of nursing for ambulation daily as tolerated for length of stay.      Recommendations for follow up therapy are one component of a multi-disciplinary discharge planning process, led by the attending physician.  Recommendations may be updated based on patient status, additional functional criteria and insurance authorization.  Follow Up Recommendations Home health PT      Assistance Recommended at Discharge Intermittent Supervision/Assistance  Patient can return home with the following  A little help with walking and/or transfers;A little help with bathing/dressing/bathroom;Assistance with cooking/housework    Equipment Recommendations Rolling walker (2 wheels)  Recommendations for Other Services       Functional Status Assessment Patient has had a recent decline in their functional status and demonstrates the ability to make significant improvements in function in a reasonable and predictable amount of time.     Precautions / Restrictions Precautions Precautions: Fall Restrictions Weight Bearing Restrictions: No      Mobility  Bed Mobility Overal bed mobility: Independent                  Transfers Overall transfer level: Modified independent Equipment used: Rolling walker (2 wheels)               General transfer comment: impulsive transfer to standing and reaches for RW upon standing    Ambulation/Gait Ambulation/Gait assistance: Min assist Gait Distance (Feet): 50 Feet Assistive device: Rolling walker (2 wheels) Gait Pattern/deviations: Decreased stride length, Ataxic Gait velocity: decreased  General Gait Details: intermittent ataxic gait on LLE, unsteady with turning, frequent cueing for proper RW use  Stairs            Wheelchair Mobility    Modified Rankin (Stroke Patients Only)       Balance Overall balance assessment: Needs assistance Sitting-balance support: No upper  extremity supported Sitting balance-Leahy Scale: Normal     Standing balance support: Bilateral upper extremity supported Standing balance-Leahy Scale: Fair Standing balance comment: with RW                             Pertinent Vitals/Pain Pain Assessment Pain Assessment: No/denies pain    Home Living Family/patient expects to be discharged to:: Private residence Living Arrangements: Alone Available Help at Discharge: Other (Comment) (ex wife) Type of Home: Apartment Home Access: Level entry       Home Layout: One level Home Equipment: None      Prior Function Prior Level of Function : Independent/Modified Independent             Mobility Comments: states community ambulation without AD ADLs Comments: states independent     Hand Dominance        Extremity/Trunk Assessment   Upper Extremity Assessment Upper Extremity Assessment: Overall WFL for tasks assessed    Lower Extremity Assessment Lower Extremity Assessment: LLE deficits/detail LLE Deficits / Details: MMT 5/5 bilateral, decreased light touch L4, L5, S1 on L       Communication   Communication: No difficulties  Cognition Arousal/Alertness: Awake/alert Behavior During Therapy: WFL for tasks assessed/performed Overall Cognitive Status: Within Functional Limits for tasks assessed                                          General Comments      Exercises     Assessment/Plan    PT Assessment All further PT needs can be met in the next venue of care  PT Problem List Decreased mobility;Decreased coordination;Decreased activity tolerance;Decreased balance;Decreased knowledge of use of DME       PT Treatment Interventions      PT Goals (Current goals can be found in the Care Plan section)  Acute Rehab PT Goals Patient Stated Goal: return home PT Goal Formulation: With patient Time For Goal Achievement: 09/07/22 Potential to Achieve Goals: Good    Frequency        Co-evaluation               AM-PAC PT "6 Clicks" Mobility  Outcome Measure Help needed turning from your back to your side while in a flat bed without using bedrails?: None Help needed moving from lying on your back to sitting on the side of a flat bed without using bedrails?: None Help needed moving to and from a bed to a chair (including a wheelchair)?: A Little Help needed standing up from a chair using your arms (e.g., wheelchair or bedside chair)?: A Little Help needed to walk in hospital room?: A Little Help needed climbing 3-5 steps with a railing? : A Lot 6 Click Score: 19    End of Session Equipment Utilized During Treatment: Gait belt Activity Tolerance: Patient tolerated treatment well Patient left: in bed;with call bell/phone within reach Nurse Communication: Mobility status PT Visit Diagnosis: Unsteadiness on feet (R26.81);Other abnormalities of gait and mobility (R26.89)  Time: 7106-2694 PT Time Calculation (min) (ACUTE ONLY): 12 min   Charges:   PT Evaluation $PT Eval Low Complexity: 1 Low PT Treatments $Therapeutic Activity: 8-22 mins        10:28 AM, 09/07/22 Mearl Latin PT, DPT Physical Therapist at [facility]

## 2022-09-07 NOTE — Progress Notes (Signed)
Patient agreeable to wear heart monitor and continue to wear blood pressure cuff. Patient is pleasant and says he will "stick it out until tomorrow" and states that he will need something to relax him before his MRI tomorrow.

## 2022-09-08 ENCOUNTER — Telehealth: Payer: Self-pay | Admitting: *Deleted

## 2022-09-08 ENCOUNTER — Inpatient Hospital Stay (HOSPITAL_COMMUNITY): Payer: Medicare HMO

## 2022-09-08 DIAGNOSIS — R7989 Other specified abnormal findings of blood chemistry: Secondary | ICD-10-CM | POA: Diagnosis not present

## 2022-09-08 DIAGNOSIS — I6389 Other cerebral infarction: Secondary | ICD-10-CM | POA: Diagnosis not present

## 2022-09-08 DIAGNOSIS — I161 Hypertensive emergency: Secondary | ICD-10-CM | POA: Diagnosis not present

## 2022-09-08 DIAGNOSIS — K219 Gastro-esophageal reflux disease without esophagitis: Secondary | ICD-10-CM | POA: Diagnosis not present

## 2022-09-08 DIAGNOSIS — I639 Cerebral infarction, unspecified: Secondary | ICD-10-CM

## 2022-09-08 LAB — CBC
HCT: 40.7 % (ref 39.0–52.0)
Hemoglobin: 13.9 g/dL (ref 13.0–17.0)
MCH: 32.6 pg (ref 26.0–34.0)
MCHC: 34.2 g/dL (ref 30.0–36.0)
MCV: 95.3 fL (ref 80.0–100.0)
Platelets: 124 10*3/uL — ABNORMAL LOW (ref 150–400)
RBC: 4.27 MIL/uL (ref 4.22–5.81)
RDW: 13.4 % (ref 11.5–15.5)
WBC: 8.2 10*3/uL (ref 4.0–10.5)
nRBC: 0 % (ref 0.0–0.2)

## 2022-09-08 LAB — COMPREHENSIVE METABOLIC PANEL
ALT: 117 U/L — ABNORMAL HIGH (ref 0–44)
AST: 100 U/L — ABNORMAL HIGH (ref 15–41)
Albumin: 3.1 g/dL — ABNORMAL LOW (ref 3.5–5.0)
Alkaline Phosphatase: 81 U/L (ref 38–126)
Anion gap: 7 (ref 5–15)
BUN: 14 mg/dL (ref 8–23)
CO2: 24 mmol/L (ref 22–32)
Calcium: 8.6 mg/dL — ABNORMAL LOW (ref 8.9–10.3)
Chloride: 108 mmol/L (ref 98–111)
Creatinine, Ser: 1.02 mg/dL (ref 0.61–1.24)
GFR, Estimated: >60 mL/min (ref >60)
Glucose, Bld: 123 mg/dL — ABNORMAL HIGH (ref 70–99)
Potassium: 3.4 mmol/L — ABNORMAL LOW (ref 3.5–5.1)
Sodium: 139 mmol/L (ref 135–145)
Total Bilirubin: 0.6 mg/dL (ref 0.3–1.2)
Total Protein: 7.1 g/dL (ref 6.5–8.1)

## 2022-09-08 LAB — LIPID PANEL
Cholesterol: 133 mg/dL (ref 0–200)
HDL: 23 mg/dL — ABNORMAL LOW (ref >40)
LDL Cholesterol: 79 mg/dL (ref 0–99)
Total CHOL/HDL Ratio: 5.8 RATIO
Triglycerides: 157 mg/dL — ABNORMAL HIGH (ref <150)
VLDL: 31 mg/dL (ref 0–40)

## 2022-09-08 MED ORDER — ATORVASTATIN CALCIUM 40 MG PO TABS: 40.0000 mg | ORAL_TABLET | Freq: Every evening | ORAL | Status: DC

## 2022-09-08 MED ORDER — POTASSIUM CHLORIDE CRYS ER 20 MEQ PO TBCR
40.0000 meq | EXTENDED_RELEASE_TABLET | Freq: Once | ORAL | Status: AC
  Administered 2022-09-08: 40 meq via ORAL
  Filled 2022-09-08: qty 2

## 2022-09-08 MED ORDER — AMLODIPINE BESYLATE 10 MG PO TABS: 10.0000 mg | ORAL_TABLET | Freq: Every day | ORAL | 1 refills | Status: AC

## 2022-09-08 MED ORDER — ADULT MULTIVITAMIN W/MINERALS CH: 1.0000 | ORAL_TABLET | Freq: Every day | ORAL | 1 refills | Status: AC

## 2022-09-08 MED ORDER — ATORVASTATIN CALCIUM 40 MG PO TABS: 40.0000 mg | ORAL_TABLET | Freq: Every evening | ORAL | 1 refills | Status: AC

## 2022-09-08 MED ORDER — LANSOPRAZOLE 30 MG PO CPDR: 30.0000 mg | DELAYED_RELEASE_CAPSULE | Freq: Every day | ORAL | 1 refills | Status: DC

## 2022-09-08 MED ORDER — CLOPIDOGREL BISULFATE 75 MG PO TABS
75.0000 mg | ORAL_TABLET | Freq: Every day | ORAL | Status: DC
  Administered 2022-09-08: 75 mg via ORAL
  Filled 2022-09-08: qty 1

## 2022-09-08 MED ORDER — FOLIC ACID 1 MG PO TABS: 1.0000 mg | ORAL_TABLET | Freq: Every day | ORAL | 1 refills | Status: AC

## 2022-09-08 MED ORDER — LOSARTAN POTASSIUM 50 MG PO TABS: 50.0000 mg | ORAL_TABLET | Freq: Every day | ORAL | 1 refills | Status: DC

## 2022-09-08 MED ORDER — CLOPIDOGREL BISULFATE 75 MG PO TABS: 75.0000 mg | ORAL_TABLET | Freq: Every day | ORAL | 0 refills | Status: AC

## 2022-09-08 MED ORDER — LOSARTAN POTASSIUM 50 MG PO TABS: 50.0000 mg | ORAL_TABLET | Freq: Every day | ORAL | Status: DC

## 2022-09-08 MED ORDER — ASPIRIN 81 MG PO CHEW: 81.0000 mg | CHEWABLE_TABLET | Freq: Every day | ORAL | 3 refills | Status: DC

## 2022-09-08 MED ORDER — VITAMIN B-1 100 MG PO TABS: 100.0000 mg | ORAL_TABLET | Freq: Every day | ORAL | 1 refills | Status: DC

## 2022-09-08 MED ORDER — AMLODIPINE BESYLATE 5 MG PO TABS: 10.0000 mg | ORAL_TABLET | Freq: Every day | ORAL | Status: DC

## 2022-09-08 NOTE — TOC Initial Note (Signed)
Transition of Care [facility]) - Initial/Assessment Note    Patient Details  Name: Randy Ashley MRN: [id number] Date of Birth: 1955-12-21  Transition of Care [facility]) CM/SW Contact:    Salome Arnt, [place] Phone Number: 09/08/2022, 8:32 AM  Clinical Narrative:  Pt admitted due to hypertensive emergency. Assessment completed with pt. He reports he lives alone and manages well. Pt has transportation to appointments. Discussed recommendation for home health and rolling walker. Pt states he wants to complete medical workup and make decision on HH/DME closer to d/c. TOC received consult for substance abuse counseling. Pt indicates he drinks "too much" but does not feel it is a problem for him. He plans to cut back and said he has done this several times before on his own. Pt declines any resources. TOC will continue to follow and will address HH/DME again with pt at later date.                 Expected Discharge Plan: [place] Barriers to Discharge: Continued Medical Work up   Patient Goals and CMS Choice Patient states their goals for this hospitalization and ongoing recovery are:: return home   Choice offered to / list presented to : Patient  Expected Discharge Plan and Services Expected Discharge Plan: [place] In-house Referral: Clinical Social Work     Living arrangements for the past 2 months: Apartment                                      Prior Living Arrangements/Services Living arrangements for the past 2 months: Apartment Lives with:: Self Patient language and need for interpreter reviewed:: Yes Do you feel safe going back to the place where you live?: Yes      Need for Family Participation in Patient Care: No (Comment)     Criminal Activity/Legal Involvement Pertinent to Current Situation/Hospitalization: No - Comment as needed  Activities of Daily Living Home Assistive Devices/Equipment: Eyeglasses ADL Screening  (condition at time of admission) Patient's cognitive ability adequate to safely complete daily activities?: Yes Is the patient deaf or have difficulty hearing?: No Does the patient have difficulty seeing, even when wearing glasses/contacts?: No Does the patient have difficulty concentrating, remembering, or making decisions?: No Patient able to express need for assistance with ADLs?: Yes Does the patient have difficulty dressing or bathing?: No Independently performs ADLs?: Yes (appropriate for developmental age) Does the patient have difficulty walking or climbing stairs?: No Weakness of Legs: Left Weakness of Arms/Hands: Left  Permission Sought/Granted                  Emotional Assessment     Affect (typically observed): Appropriate Orientation: : Oriented to Self, Oriented to  Time, Oriented to Situation, Oriented to Place Alcohol / Substance Use: Alcohol Use Psych Involvement: No (comment)  Admission diagnosis:  Hypertensive emergency [I16.1] Left sided numbness [R20.0] Patient Active Problem List   Diagnosis Date Noted   Hypertensive emergency 09/06/2022   Acute focal neurological deficit 09/06/2022   Uncontrolled hypertension 09/06/2022   Smoker 09/06/2022   Heavy alcohol consumption 09/06/2022   Elevated LFTs 09/06/2022   Thrombocytopenia ([place]) 09/06/2022   Hyperglycemia 09/06/2022   GERD (gastroesophageal reflux disease) 09/06/2022   CLOSED FRACTURE OF BASE OF OTHER METACARPAL BONE 02/10/2011   PCP:  Lemmie Evens, MD Pharmacy:   [id number] - [place], [place]  [address] [facility] [address] [place] [address] Phone: [phone] Fax: [phone]     Social Determinants of Health (SDOH) Interventions    Readmission Risk Interventions     No data to display

## 2022-09-08 NOTE — Consult Note (Signed)
I connected with  Randy Ashley on 09/08/22 by a video enabled telemedicine application and verified that I am speaking with the correct person using two identifiers.   I discussed the limitations of evaluation and management by telemedicine. The patient expressed understanding and agreed to proceed.  Location of patient: [facility] Location of physician: [facility]   Neurology Consultation Reason for Consult: Left-sided numbness Referring Physician: Dr. Berle Mull  CC: Left-sided numbness  History is obtained from: Patient, chart review  HPI: Randy Ashley is a 66 y.o. male with past medical history of hypertension, alcohol use disorder, nicotine use disorder who presented with left-sided numbness.  States he woke up at baseline and around 0600 yesterday when he went to shower he noted numbness in left side, more in left leg. It persisted so he eventually came to ED on 09/06/2022. BP on arrival was 214/83.  LKN 0600 09/05/2022 Event happened at home No tap as outside window No thrombectomy as no LVO mRS 0  Of note, patient reports drinking " you dont want to know" daily and smoking but motivated to quit.    ROS: All other systems reviewed and negative except as noted in the HPI.   Past Medical History:  Diagnosis Date   Chronic back pain    Hypertension    Lumbar radiculopathy     Family History  Problem Relation Age of Onset   Diabetes Other        Family History    Heart disease Other        Family History    Arthritis Other        Family History     Social History:  reports that he has been smoking cigarettes. He has been smoking an average of 1 pack per day. He has never used smokeless tobacco. He reports current alcohol use. He reports that he does not use drugs.   Medications Prior to Admission  Medication Sig Dispense Refill Last Dose   amLODipine (NORVASC) 2.5 MG tablet Take 1 tablet (2.5 mg total) by mouth daily. 30 tablet 0 Past  Week   ibuprofen (ADVIL) 200 MG tablet Take 200 mg by mouth every 6 (six) hours as needed for headache or mild pain.   Past Week   lansoprazole (PREVACID) 30 MG capsule Take 1 capsule (30 mg total) by mouth daily. 30 capsule 1 Past Week   meclizine (ANTIVERT) 25 MG tablet Take 25 mg by mouth every 8 (eight) hours as needed for dizziness.   Past Week   aspirin 81 MG chewable tablet Chew 1 tablet (81 mg total) by mouth daily. (Patient not taking: Reported on 09/07/2022) 30 tablet 0 Not Taking      Exam: Current vital signs: BP (!) 143/56   Pulse 65   Temp 98.1 F (36.7 C) (Oral)   Resp 12   Ht 5\' 7"  (1.702 m)   Wt 65.1 kg   SpO2 99%   BMI 22.48 kg/m  Vital signs in last 24 hours: Temp:  [97.7 F (36.5 C)-98.1 F (36.7 C)] 98.1 F (36.7 C) (10/02 0746) Pulse Rate:  [62-65] 65 (10/02 0800) Resp:  [12-22] 12 (10/02 1000) BP: (131-177)/(52-81) 143/56 (10/02 1000) SpO2:  [99 %] 99 % (10/02 0800) Weight:  [65.1 kg] 65.1 kg (10/02 0512)   Physical Exam  Constitutional: Appears well-developed and well-nourished.  Psych: Affect appropriate to situation Eyes: No scleral injection Neuro: AOX3, CN 2-13 grossly intact, antigravity strength in all extremities, decreased sensation  to light tough on left, FTN intact  NIHSS 1   I have reviewed labs in epic and the results pertinent to this consultation are: CBC:  Recent Labs  Lab 09/06/22 1000 09/08/22 0356  WBC 7.6 8.2  NEUTROABS 3.2  --   HGB 14.3 13.9  HCT 42.4 40.7  MCV 96.8 95.3  PLT 131* 124*    Basic Metabolic Panel:  Lab Results  Component Value Date   NA 139 09/08/2022   K 3.4 (L) 09/08/2022   CO2 24 09/08/2022   GLUCOSE 123 (H) 09/08/2022   BUN 14 09/08/2022   CREATININE 1.02 09/08/2022   CALCIUM 8.6 (L) 09/08/2022   GFRNONAA >60 09/08/2022   GFRAA >60 02/22/2019   Lipid Panel: No results found for: "LDLCALC" HgbA1c:  Lab Results  Component Value Date   HGBA1C 5.8 (H) 09/06/2022   Urine Drug Screen:      Component Value Date/Time   LABOPIA NONE DETECTED 09/06/2022 1342   COCAINSCRNUR NONE DETECTED 09/06/2022 1342   LABBENZ NONE DETECTED 09/06/2022 1342   AMPHETMU NONE DETECTED 09/06/2022 1342   THCU NONE DETECTED 09/06/2022 1342   LABBARB NONE DETECTED 09/06/2022 1342    Alcohol Level No results found for: "ETH"   I have reviewed the images obtained:  CT head without contrast 09/06/2022: No acute intracranial abnormalities. MRI brain without contrast 09/08/2022: MRA head without contrast 09/08/2022: TTE 07/11/2022: No PFO.     ASSESSMENT/PLAN: 66 year old male who presented with left-sided numbness and was noted to have acute ischemic stroke.  Acute ischemic stroke HTN HLD Nicotine use disorder Alcohol use disorder -Etiology: cardioembolic  Recommendations: - TTE ordered and pending. If negative for thrombus, recommend 30 day event monitor at discharge -Recommend aspirin 81 mg daily as well as Plavix 75 mg for 3 weeks followed by asa 81mg  daily -Goal blood pressure: Normotension -Nicotine cessation counseling, alcohol cessation counseling -Stroke risk factor modification -Stroke education including BEFAST -With neurology in 3 months -Management of rest of comorbidities per primary team - Discussed plan with patient and Dr Wynetta Emery  Thank you for allowing [place] to participate in the care of this patient. If you have any further questions, please contact  me or neurohospitalist.   [facility] neurohospitalist

## 2022-09-08 NOTE — Discharge Summary (Addendum)
Physician Discharge Summary  Randy Ashley [id number] DOB: 15-Apr-1956 DOA: 09/06/2022  PCP: Lemmie Evens, MD  Admit date: 09/06/2022 Discharge date: 09/08/2022  Admitted From: HOME  Disposition: HOME   Recommendations for Outpatient Follow-up:  Follow up with PCP in 1 weeks Please follow up with neurology in 3 months  Please follow up on the following pending results: 2D Echocardiogram (bubble study)   Discharge Condition: STABLE   CODE STATUS: FULL  DIET: 2 gram sodium restricted    Brief Hospitalization Summary: Please see all hospital notes, images, labs for full details of the hospitalization. ADMISSION HPI:  66 year old gentleman with recently diagnosed hypertension, daily alcohol consumer, smoker, GERD presents to the ED complaining of left-sided numbness since yesterday morning.  He reportedly was recently diagnosed with hypertension and started on amlodipine 2.5 mg daily from an ED visit on August 05, 2022.  He says he has not been taking it regularly.  He reported that yesterday morning after a shower he started to feel the symptoms of left-sided numbness.  The symptoms have persisted prompting his visit.  He denies chest pain.  He denies shortness of breath.  He denies vision changes.  He denies rash.  He reportedly has been smoking cigarettes since age 63.  His blood pressure was notably elevated on arrival with a SBP up to 217.  He had a CT brain with no acute findings.  Neurology was consulted and felt that patient can safely stay at [facility].  MRI not recommended at this time.  Treating hypertensive emergency is recommendation for now.  If he were to develop worsening symptoms he will need an MRI of the brain.  Patient is being admitted for further management.  HOSPITAL COURSE BY PROBLEM   Assessment and Plan: * Hypertensive emergency - As evidenced by acute neurological changes - Goal is to reduce BP by 25% in the first hour and then over the next 2 to 6 hours try to  get goal to 160 SBP -Started nicardipine infusion which is titratable to goals and monitor in stepdown ICU -- Now off IV nicardipine and on losartan 50 mg daily, amlodipine 10 mg   Heavy alcohol consumption - At least 6 pack/day of beer -Patient admits to daily alcohol drinking -CIWA protocol and added librium 5 mg TID, counseled on cessation   Acute CVA (cerebrovascular accident) ([place]) - Consistently with symptoms of left-sided numbness involving the upper and lower extremity.  Mild loss of power strength on the left side.  -CT head reassuring of no hemorrhagic CVA: Pt refused to get repeat CT 10/1 and threatening to leave AMA.  -consult teleneurology 10/2 (not available on weekends at [facility]) --MRI confirms acute CVA  --neurology recommends ASA 81 mg and plavix 75 mg x 21 days, then f/b ASA 81 mg daily, atorvastatin 40 mg daily, normotension, TTE, 30 day cardiac monitor --I was able to make arrangements for patient to receive a 30 day cardiac monitor delivered to his home.  [place] will send monitor to his home.   GERD (gastroesophageal reflux disease) - Protonix for GI protection  Hyperglycemia --A1c 5.8% c/w prediabetes mellitus   Thrombocytopenia (HCC) - Monitor for bleeding complications, careful with heparins  Elevated LFTs - Likely due to chronic alcoholism - Abdominal ultrasound reassuring  Smoker - Nicotine patch ordered and counseled on smoking cessation, he reports he has been smoking since age 71 and not ready to quit  Uncontrolled hypertension - See above plan and recommendation  Discharge Diagnoses:  Principal Problem:   Hypertensive emergency Active Problems:   Acute focal neurological deficit   Heavy alcohol consumption   Uncontrolled hypertension   Smoker   Elevated LFTs   Thrombocytopenia (HCC)   Hyperglycemia   GERD (gastroesophageal reflux disease)   Discharge Instructions: Discharge Instructions     Ambulatory referral to  Neurology   Complete by: As directed    An appointment is requested in approximately: 4 weeks      Allergies as of 09/08/2022   No Known Allergies      Medication List     STOP taking these medications    ibuprofen 200 MG tablet Commonly known as: ADVIL       TAKE these medications    amLODipine 10 MG tablet Commonly known as: NORVASC Take 1 tablet (10 mg total) by mouth daily. What changed:  medication strength how much to take   aspirin 81 MG chewable tablet Chew 1 tablet (81 mg total) by mouth daily.   atorvastatin 40 MG tablet Commonly known as: LIPITOR Take 1 tablet (40 mg total) by mouth every evening.   clopidogrel 75 MG tablet Commonly known as: Plavix Take 1 tablet (75 mg total) by mouth daily for 21 days.   folic acid 1 MG tablet Commonly known as: FOLVITE Take 1 tablet (1 mg total) by mouth daily. Start taking on: September 09, 2022   lansoprazole 30 MG capsule Commonly known as: Prevacid Take 1 capsule (30 mg total) by mouth daily.   losartan 50 MG tablet Commonly known as: COZAAR Take 1 tablet (50 mg total) by mouth daily. Start taking on: September 09, 2022   meclizine 25 MG tablet Commonly known as: ANTIVERT Take 25 mg by mouth every 8 (eight) hours as needed for dizziness.   multivitamin with minerals Tabs tablet Take 1 tablet by mouth daily. Start taking on: September 09, 2022   thiamine 100 MG tablet Commonly known as: Vitamin B-1 Take 1 tablet (100 mg total) by mouth daily. Start taking on: September 09, 2022               Durable Medical Equipment  (From admission, onward)           Start     Ordered   09/07/22 1029  For home use only DME Walker rolling  Once       Question Answer Comment  Walker: With [place] Wheels   Patient needs a walker to treat with the following condition Abnormality of gait and mobility   Patient needs a walker to treat with the following condition Unsteadiness      09/07/22 1029             Follow-up Information     Lemmie Evens, MD. Schedule an appointment as soon as possible for a visit in 1 week(s).   Specialty: Family Medicine Why: Hospital Follow Up Contact information: [address] [place] [address] [phone]         [facility]. Schedule an appointment as soon as possible for a visit in 1 month(s).   Specialty: Neurology Why: Hospital Follow Up Contact information: [address] [place] [phone]               No Known Allergies Allergies as of 09/08/2022   No Known Allergies      Medication List     STOP taking these medications    ibuprofen 200 MG tablet Commonly known as:  ADVIL       TAKE these medications    amLODipine 10 MG tablet Commonly known as: NORVASC Take 1 tablet (10 mg total) by mouth daily. What changed:  medication strength how much to take   aspirin 81 MG chewable tablet Chew 1 tablet (81 mg total) by mouth daily.   atorvastatin 40 MG tablet Commonly known as: LIPITOR Take 1 tablet (40 mg total) by mouth every evening.   clopidogrel 75 MG tablet Commonly known as: Plavix Take 1 tablet (75 mg total) by mouth daily for 21 days.   folic acid 1 MG tablet Commonly known as: FOLVITE Take 1 tablet (1 mg total) by mouth daily. Start taking on: September 09, 2022   lansoprazole 30 MG capsule Commonly known as: Prevacid Take 1 capsule (30 mg total) by mouth daily.   losartan 50 MG tablet Commonly known as: COZAAR Take 1 tablet (50 mg total) by mouth daily. Start taking on: September 09, 2022   meclizine 25 MG tablet Commonly known as: ANTIVERT Take 25 mg by mouth every 8 (eight) hours as needed for dizziness.   multivitamin with minerals Tabs tablet Take 1 tablet by mouth daily. Start taking on: September 09, 2022   thiamine 100 MG tablet Commonly known as: Vitamin B-1 Take 1 tablet (100 mg total) by mouth daily. Start taking  on: September 09, 2022               Durable Medical Equipment  (From admission, onward)           Start     Ordered   09/07/22 1029  For home use only DME Walker rolling  Once       Question Answer Comment  Walker: With 5 Inch Wheels   Patient needs a walker to treat with the following condition Abnormality of gait and mobility   Patient needs a walker to treat with the following condition Unsteadiness      09/07/22 1029            Procedures/Studies: MR BRAIN WO CONTRAST  Result Date: 09/08/2022 CLINICAL DATA:  Provided history: Neuro deficit, acute, stroke suspected. EXAM: MRI HEAD WITHOUT CONTRAST MRA HEAD WITHOUT CONTRAST TECHNIQUE: Multiplanar, multi-echo pulse sequences of the brain and surrounding structures were acquired without intravenous contrast. Angiographic images of the Circle of Willis were acquired using MRA technique without intravenous contrast. COMPARISON:  Prior head CT examinations 09/06/2022 and earlier. FINDINGS: MRI HEAD FINDINGS Brain: No age advanced or lobar predominant parenchymal atrophy. Multiple recent infarcts, as follows. 7 mm acute infarct within the dorsal right pons (series 5, image 9). 4 mm acute infarct within the right thalamus (series 5, image 18). 4 mm acute/early subacute infarct within the right frontoparietal periventricular white matter (series 7, image 12) (series 5, image 24). 6 mm acute/early subacute infarct within the posterior right frontal lobe white matter (series 5, image 28). 4 mm acute/early subacute infarct within the anterior right subinsular white matter (series 5, image 19). Chronic small-vessel infarct within the right corona radiata/basal ganglia. Chronic lacunar infarcts elsewhere within the bilateral deep gray nuclei. Background mild multifocal T2 FLAIR hyperintense signal abnormality within the cerebral white matter, nonspecific but compatible with chronic small vessel disease. No evidence of an intracranial mass. No  chronic intracranial blood products. No extra-axial fluid collection. No midline shift. Vascular: Maintained flow voids within the proximal large arterial vessels. Skull and upper cervical spine: No focal suspicious marrow lesion. Sinuses/Orbits: No mass or acute finding within  the imaged orbits. No significant paranasal sinus disease. Other: Trace fluid within the left mastoid air cells. 1.6 cm lesion within the posterior right upper neck with associated restricted diffusion, nonspecific but likely reflecting an epidermal inclusion cyst. MRA HEAD FINDINGS Anterior circulation: The intracranial internal carotid arteries are patent. The M1 middle cerebral arteries are patent. Atherosclerotic irregularity of the M2 and more distal MCA vessels, bilaterally. No M2 proximal branch occlusion or high-grade proximal stenosis is identified. The anterior cerebral arteries are patent. No intracranial aneurysm is identified. Posterior circulation: The intracranial vertebral arteries are patent. The basilar artery is patent. The posterior cerebral arteries are patent. Moderate/severe focal stenosis within the right PCA at P2/P3 junction. Moderate/severe focal stenosis within the left PCA at the P2/P3 junction. Posterior communicating arteries are present bilaterally. Anatomic variants: None significant IMPRESSION: MRI brain: 1. 7 mm acute infarct within the dorsal right pons. 2. 4 mm acute infarct within the right thalamus. 3. 4 mm acute/early subacute infarct within the right frontoparietal periventricular white matter. 4. 6 mm acute/early subacute infarct within the posterior right frontal lobe white matter. 5. 4 mm acute/early subacute infarct within the anterior right subinsular white matter 6. Given that the above described infarcts involve multiple vascular territories, consider an embolic process. 7. Background chronic small vessel ischemic disease with multiple chronic small-vessel infarcts, as described. MRA head: 1. No  intracranial large vessel occlusion is identified. 2. Intracranial atherosclerotic disease. Most notably, there are apparent moderate/severe stenoses within the bilateral posterior cerebral arteries at the P2/P3 junctions. Electronically Signed   By: Kellie Simmering D.O.   On: 09/08/2022 14:09   MR ANGIO HEAD WO CONTRAST  Result Date: 09/08/2022 CLINICAL DATA:  Provided history: Neuro deficit, acute, stroke suspected. EXAM: MRI HEAD WITHOUT CONTRAST MRA HEAD WITHOUT CONTRAST TECHNIQUE: Multiplanar, multi-echo pulse sequences of the brain and surrounding structures were acquired without intravenous contrast. Angiographic images of the Circle of Willis were acquired using MRA technique without intravenous contrast. COMPARISON:  Prior head CT examinations 09/06/2022 and earlier. FINDINGS: MRI HEAD FINDINGS Brain: No age advanced or lobar predominant parenchymal atrophy. Multiple recent infarcts, as follows. 7 mm acute infarct within the dorsal right pons (series 5, image 9). 4 mm acute infarct within the right thalamus (series 5, image 18). 4 mm acute/early subacute infarct within the right frontoparietal periventricular white matter (series 7, image 12) (series 5, image 24). 6 mm acute/early subacute infarct within the posterior right frontal lobe white matter (series 5, image 28). 4 mm acute/early subacute infarct within the anterior right subinsular white matter (series 5, image 19). Chronic small-vessel infarct within the right corona radiata/basal ganglia. Chronic lacunar infarcts elsewhere within the bilateral deep gray nuclei. Background mild multifocal T2 FLAIR hyperintense signal abnormality within the cerebral white matter, nonspecific but compatible with chronic small vessel disease. No evidence of an intracranial mass. No chronic intracranial blood products. No extra-axial fluid collection. No midline shift. Vascular: Maintained flow voids within the proximal large arterial vessels. Skull and upper  cervical spine: No focal suspicious marrow lesion. Sinuses/Orbits: No mass or acute finding within the imaged orbits. No significant paranasal sinus disease. Other: Trace fluid within the left mastoid air cells. 1.6 cm lesion within the posterior right upper neck with associated restricted diffusion, nonspecific but likely reflecting an epidermal inclusion cyst. MRA HEAD FINDINGS Anterior circulation: The intracranial internal carotid arteries are patent. The M1 middle cerebral arteries are patent. Atherosclerotic irregularity of the M2 and more distal MCA vessels, bilaterally. No M2 proximal  branch occlusion or high-grade proximal stenosis is identified. The anterior cerebral arteries are patent. No intracranial aneurysm is identified. Posterior circulation: The intracranial vertebral arteries are patent. The basilar artery is patent. The posterior cerebral arteries are patent. Moderate/severe focal stenosis within the right PCA at P2/P3 junction. Moderate/severe focal stenosis within the left PCA at the P2/P3 junction. Posterior communicating arteries are present bilaterally. Anatomic variants: None significant IMPRESSION: MRI brain: 1. 7 mm acute infarct within the dorsal right pons. 2. 4 mm acute infarct within the right thalamus. 3. 4 mm acute/early subacute infarct within the right frontoparietal periventricular white matter. 4. 6 mm acute/early subacute infarct within the posterior right frontal lobe white matter. 5. 4 mm acute/early subacute infarct within the anterior right subinsular white matter 6. Given that the above described infarcts involve multiple vascular territories, consider an embolic process. 7. Background chronic small vessel ischemic disease with multiple chronic small-vessel infarcts, as described. MRA head: 1. No intracranial large vessel occlusion is identified. 2. Intracranial atherosclerotic disease. Most notably, there are apparent moderate/severe stenoses within the bilateral  posterior cerebral arteries at the P2/P3 junctions. Electronically Signed   By: Kellie Simmering D.O.   On: 09/08/2022 14:09   US Abdomen Limited  Result Date: 09/06/2022 CLINICAL DATA:  Elevated LFTs EXAM: ULTRASOUND ABDOMEN LIMITED RIGHT UPPER QUADRANT COMPARISON:  None Available. FINDINGS: Gallbladder: No gallstones or wall thickening visualized. No sonographic Murphy sign noted by sonographer. Common bile duct: Diameter: 3.0 Liver: No focal lesion identified. Within normal limits in parenchymal echogenicity. Portal vein is patent on color Doppler imaging with normal direction of blood flow towards the liver. Other: None. IMPRESSION: Normal RIGHT upper quadrant ultrasound. Normal liver by ultrasound evaluation Electronically Signed   By: Suzy Bouchard M.D.   On: 09/06/2022 11:26   CT Head Wo Contrast  Result Date: 09/06/2022 CLINICAL DATA:  Patient reported taking a shower yesterday when he began to have left-sided body numbness. Left hand and side are numb at this time. EXAM: CT HEAD WITHOUT CONTRAST TECHNIQUE: Contiguous axial images were obtained from the base of the skull through the vertex without intravenous contrast. RADIATION DOSE REDUCTION: This exam was performed according to the departmental dose-optimization program which includes automated exposure control, adjustment of the mA and/or kV according to patient size and/or use of iterative reconstruction technique. COMPARISON:  08/05/2022. FINDINGS: Brain: No evidence of acute infarction, hemorrhage, hydrocephalus, extra-axial collection or mass lesion/mass effect. 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 Vascular: No hyperdense vessel or unexpected calcification. Skull: Normal. Negative for fracture or focal lesion. Sinuses/Orbits: Globes and orbits are unremarkable. Visualized sinuses are clear. Other: None. IMPRESSION: 1. No acute intracranial abnormalities. Electronically Signed   By: Lajean Manes M.D.   On: 09/06/2022 10:48     Subjective: Pt reports he still has numbness in his left leg and left arm.  Strength has improved on left side   Discharge Exam: Vitals:   09/08/22 1232 09/08/22 1628  BP: (!) 151/66   Pulse: 67   Resp:    Temp:  98.5 F (36.9 C)  SpO2:     Vitals:   09/08/22 1000 09/08/22 1100 09/08/22 1232 09/08/22 1628  BP: (!) 143/56  (!) 151/66   Pulse:   67   Resp: 12 16    Temp:    98.5 F (36.9 C)  TempSrc:    Oral  SpO2:      Weight:  Height:       General: Pt is alert, awake, not in acute distress Cardiovascular: RRR, S1/S2 +, no rubs, no gallops Respiratory: CTA bilaterally, no wheezing, no rhonchi Abdominal: Soft, NT, ND, bowel sounds + Extremities: no edema, no cyanosis Neurological: paresthesia LLE, LUE   The results of significant diagnostics from this hospitalization (including imaging, microbiology, ancillary and laboratory) are listed below for reference.     Microbiology: Recent Results (from the past 240 hour(s))  MRSA Next Gen by PCR, Nasal     Status: None   Collection Time: 09/06/22  4:19 PM   Specimen: Nasal Mucosa; Nasal Swab  Result Value Ref Range Status   MRSA by PCR Next Gen NOT DETECTED NOT DETECTED Final    Comment: (NOTE) The GeneXpert MRSA Assay (FDA approved for NASAL specimens only), is one component of a comprehensive MRSA colonization surveillance program. It is not intended to diagnose MRSA infection nor to guide or monitor treatment for MRSA infections. Test performance is not FDA approved in patients less than 47 years old. Performed at [facility], [address], [place], [place] [address]      Labs: BNP (last 3 results) Recent Labs    09/06/22 1000  BNP 0000000   Basic Metabolic Panel: Recent Labs  Lab 09/06/22 1000 09/07/22 0451 09/08/22 0356  NA 138  --  139  K 3.8  --  3.4*  CL 110  --  108  CO2 24  --  24  GLUCOSE 111*  --  123*  BUN 21  --  14  CREATININE 1.09  --  1.02   CALCIUM 8.6*  --  8.6*  MG  --  1.9  --    Liver Function Tests: Recent Labs  Lab 09/06/22 1000 09/08/22 0356  AST 182* 100*  ALT 173* 117*  ALKPHOS 93 81  BILITOT 0.4 0.6  PROT 7.8 7.1  ALBUMIN 3.4* 3.1*   No results for input(s): "LIPASE", "AMYLASE" in the last 168 hours. No results for input(s): "AMMONIA" in the last 168 hours. CBC: Recent Labs  Lab 09/06/22 1000 09/08/22 0356  WBC 7.6 8.2  NEUTROABS 3.2  --   HGB 14.3 13.9  HCT 42.4 40.7  MCV 96.8 95.3  PLT 131* 124*   Cardiac Enzymes: No results for input(s): "CKTOTAL", "CKMB", "CKMBINDEX", "TROPONINI" in the last 168 hours. BNP: Invalid input(s): "POCBNP" CBG: No results for input(s): "GLUCAP" in the last 168 hours. D-Dimer No results for input(s): "DDIMER" in the last 72 hours. Hgb A1c Recent Labs    09/06/22 1000  HGBA1C 5.8*   Lipid Profile Recent Labs    09/08/22 1327  CHOL 133  HDL 23*  LDLCALC 79  TRIG 157*  CHOLHDL 5.8   Thyroid function studies Recent Labs    09/06/22 1000  TSH 0.672   Anemia work up No results for input(s): "VITAMINB12", "FOLATE", "FERRITIN", "TIBC", "IRON", "RETICCTPCT" in the last 72 hours. Urinalysis    Component Value Date/Time   COLORURINE YELLOW 09/06/2022 1342   APPEARANCEUR CLEAR 09/06/2022 1342   LABSPEC 1.020 09/06/2022 1342   PHURINE 5.0 09/06/2022 1342   GLUCOSEU NEGATIVE 09/06/2022 1342   HGBUR SMALL (A) 09/06/2022 1342   BILIRUBINUR NEGATIVE 09/06/2022 1342   KETONESUR NEGATIVE 09/06/2022 1342   PROTEINUR NEGATIVE 09/06/2022 1342   UROBILINOGEN 0.2 09/11/2015 1630   NITRITE NEGATIVE 09/06/2022 1342   LEUKOCYTESUR NEGATIVE 09/06/2022 1342   Sepsis Labs Recent Labs  Lab 09/06/22 1000 09/08/22 0356  WBC 7.6 8.2  Microbiology Recent Results (from the past 240 hour(s))  MRSA Next Gen by PCR, Nasal     Status: None   Collection Time: 09/06/22  4:19 PM   Specimen: Nasal Mucosa; Nasal Swab  Result Value Ref Range Status   MRSA by PCR  Next Gen NOT DETECTED NOT DETECTED Final    Comment: (NOTE) The GeneXpert MRSA Assay (FDA approved for NASAL specimens only), is one component of a comprehensive MRSA colonization surveillance program. It is not intended to diagnose MRSA infection nor to guide or monitor treatment for MRSA infections. Test performance is not FDA approved in patients less than 21 years old. Performed at [facility], [address], [place], [place] [address]     Time coordinating discharge: 36 mins   SIGNED:  Irwin Brakeman, MD  [facility] 09/08/2022, 4:55 PM How to contact the [facility] Attending or Consulting provider [place] or covering provider during after hours [place], for this patient?  Check the care team in [facility] and look for a) attending/consulting [facility] provider listed and b) the [facility] team listed Log into [email or web address] and use [place]'s universal password to access. If you do not have the password, please contact the hospital operator. Locate the [facility] provider you are looking for under [facility] and page to a number that you can be directly reached. If you still have difficulty reaching the provider, please page the [facility] (Director on Call) for the Hospitalists listed on amion for assistance.

## 2022-09-08 NOTE — Progress Notes (Signed)
*  PRELIMINARY RESULTS* Echocardiogram 2D Echocardiogram has been performed.  Elpidio Anis 09/08/2022, 5:04 PM

## 2022-09-08 NOTE — Discharge Instructions (Addendum)
PLEASE FOLLOW UP WITH PRIMARY CARE PROVIDER TO REVIEW ECHOCARDIOGRAM RESULTS  A 30 DAY CARDIAC MONITOR WILL BE MAILED TO YOUR HOME WITH INSTRUCTIONS TO WEAR IT FOR 30 DAYS.     IMPORTANT INFORMATION: PAY CLOSE ATTENTION   PHYSICIAN DISCHARGE INSTRUCTIONS  Follow with Primary care provider  Lemmie Evens, MD  and other consultants as instructed by your Hospitalist Physician  [place] IF SYMPTOMS COME BACK, WORSEN OR NEW PROBLEM DEVELOPS   Please note: You were cared for by a hospitalist during your hospital stay. Every effort will be made to forward records to your primary care provider.  You can request that your primary care provider send for your hospital records if they have not received them.  Once you are discharged, your primary care physician will handle any further medical issues. Please note that NO REFILLS for any discharge medications will be authorized once you are discharged, as it is imperative that you return to your primary care physician (or establish a relationship with a primary care physician if you do not have one) for your post hospital discharge needs so that they can reassess your need for medications and monitor your lab values.  Please get a complete blood count and chemistry panel checked by your Primary MD at your next visit, and again as instructed by your Primary MD.  Get Medicines reviewed and adjusted: Please take all your medications with you for your next visit with your Primary MD  Laboratory/radiological data: Please request your Primary MD to go over all hospital tests and procedure/radiological results at the follow up, please ask your primary care provider to get all Hospital records sent to his/her office.  In some cases, they will be blood work, cultures and biopsy results pending at the time of your discharge. Please request that your primary care provider follow up on these results.  If you are diabetic,  please bring your blood sugar readings with you to your follow up appointment with primary care.    Please call and make your follow up appointments as soon as possible.    Also Note the following: If you experience worsening of your admission symptoms, develop shortness of breath, life threatening emergency, suicidal or homicidal thoughts you must seek medical attention immediately by calling 911 or calling your MD immediately  if symptoms less severe.  You must read complete instructions/literature along with all the possible adverse reactions/side effects for all the Medicines you take and that have been prescribed to you. Take any new Medicines after you have completely understood and accpet all the possible adverse reactions/side effects.   Do not drive when taking Pain medications or sleeping medications (Benzodiazepines)  Do not take more than prescribed Pain, Sleep and Anxiety Medications. It is not advisable to combine anxiety,sleep and pain medications without talking with your primary care practitioner  Special Instructions: If you have smoked or chewed Tobacco  in the last 2 yrs please stop smoking, stop any regular Alcohol  and or any Recreational drug use.  Wear Seat belts while driving.  Do not drive if taking any narcotic, mind altering or controlled substances or recreational drugs or alcohol.

## 2022-09-08 NOTE — Progress Notes (Signed)
09/08/2022 2:43 PM  I WAS ABLE TO SPEAK WITH [facility] [place] OFFICE AND SPOKE WITH LUKISHA PINNIX AND SHE WILL MAIL PATIENT A 30 DAY CARDIAC MONITOR.    C.Emer Onnen MD How to contact the [facility] Attending or Consulting provider [place] or covering provider during after hours [place], for this patient?  Check the care team in [facility] and look for a) attending/consulting TR[facility] provider listed and b) the [facility] team listed Log into [email or web address] and use [place]'s universal password to access. If you do not have the password, please contact the hospital operator. Locate the [facility] provider you are looking for under [facility] and page to a number that you can be directly reached. If you still have difficulty reaching the provider, please page the [facility] (Director on Call) for the Hospitalists listed on amion for assistance.

## 2022-09-08 NOTE — Telephone Encounter (Signed)
Phone call from Dr. Wynetta Emery requesting a 30 day event monitor for stroke. Pt enrolled in [employer] and order placed.

## 2022-09-08 NOTE — TOC Transition Note (Signed)
Transition of Care [facility]) - CM/SW Discharge Note   Patient Details  Name: Randy Ashley MRN: [id number] Date of Birth: Nov 26, 1956  [facility] CM/SW Contact:  Salome Arnt, LCSW Phone Number: 09/08/2022, 2:50 PM   Clinical Narrative:   Pt d/c today. LCSW followed up with pt regarding home health and he declines. He does request rolling walker. No preference on agency. Referred to [facility] with [employer] to drop ship to pt's home. Order is in. No other needs reported.     Final next level of care: Home/Self Care Barriers to Discharge: Continued Medical Work up   Patient Goals and CMS Choice Patient states their goals for this hospitalization and ongoing recovery are:: return home   Choice offered to / list presented to : Patient  Discharge Placement                    Patient and family notified of of transfer: 09/08/22  Discharge Plan and Services In-house Referral: Clinical Social Work              DME Arranged: Gilford Rile rolling DME Agency: [employer] Date DME Agency Contacted: 09/08/22 Time DME Agency Contacted: [phone] Representative spoke with at DME Agency: [place]: Refused [place]          Social Determinants of Health ([place]) Interventions     Readmission Risk Interventions     No data to display

## 2022-09-09 ENCOUNTER — Emergency Department (HOSPITAL_COMMUNITY)
Admission: EM | Admit: 2022-09-09 | Discharge: 2022-09-09 | Discharge status: Against Medical Advice | Payer: Medicare HMO | Attending: Emergency Medicine | Admitting: Emergency Medicine

## 2022-09-09 ENCOUNTER — Emergency Department (HOSPITAL_COMMUNITY): Payer: Medicare HMO

## 2022-09-09 ENCOUNTER — Other Ambulatory Visit: Payer: Self-pay

## 2022-09-09 ENCOUNTER — Encounter (HOSPITAL_COMMUNITY): Payer: Self-pay | Admitting: Emergency Medicine

## 2022-09-09 DIAGNOSIS — Z7982 Long term (current) use of aspirin: Secondary | ICD-10-CM | POA: Insufficient documentation

## 2022-09-09 DIAGNOSIS — Z79899 Other long term (current) drug therapy: Secondary | ICD-10-CM | POA: Insufficient documentation

## 2022-09-09 DIAGNOSIS — I10 Essential (primary) hypertension: Secondary | ICD-10-CM | POA: Insufficient documentation

## 2022-09-09 DIAGNOSIS — R202 Paresthesia of skin: Secondary | ICD-10-CM | POA: Diagnosis not present

## 2022-09-09 DIAGNOSIS — R200 Anesthesia of skin: Secondary | ICD-10-CM

## 2022-09-09 LAB — ECHOCARDIOGRAM COMPLETE BUBBLE STUDY
AR max vel: 2.68 cm2
AV Area VTI: 2.71 cm2
AV Area mean vel: 2.33 cm2
AV Mean grad: 3 mmHg
AV Peak grad: 5.1 mmHg
Ao pk vel: 1.13 m/s
Area-P 1/2: 3.24 cm2
MV VTI: 3.56 cm2
S' Lateral: 2.7 cm

## 2022-09-09 MED ORDER — LORAZEPAM 2 MG/ML IJ SOLN: 0.5000 mg | Freq: Once | INTRAMUSCULAR | Status: DC | PRN

## 2022-09-09 MED ORDER — IOHEXOL 350 MG/ML SOLN
75.0000 mL | Freq: Once | INTRAVENOUS | Status: AC | PRN
  Administered 2022-09-09: 75 mL via INTRAVENOUS

## 2022-09-09 NOTE — ED Notes (Signed)
AMA form signed, pt taken to ED entrance via wheelchair.

## 2022-09-09 NOTE — ED Provider Notes (Signed)
  Physical Exam  BP (!) 163/86   Pulse 63   Temp 97.7 F (36.5 C) (Oral)   Resp 18   Ht 5\' 7"  (1.702 m)   Wt 63.5 kg   SpO2 97%   BMI 21.93 kg/m   Physical Exam  Procedures  Procedures  ED Course / MDM    Medical Decision Making Amount and/or Complexity of Data Reviewed Radiology: ordered.  Risk Prescription drug management.   Received patient in signout.  Recent stroke.  Continued deficits.  Previous provider discussed with neurology.  Angiography done which was reassuring.  Echocardiogram I had read that had been done yesterday and showed no ASD.  However patient not willing to stay for MRI.  Aware of risks.  We will follow-up with PCP.  Leaving AMA and will discharge.       Davonna Belling, MD 09/09/22 1755

## 2022-09-09 NOTE — ED Provider Notes (Signed)
[facility] Provider Note   CSN: [id number] Arrival date & time: 09/09/22  4268     History  No chief complaint on file.   [facility] is a 66 y.o. male.  HPI Patient resents for left leg numbness.  Onset was 3 days ago.  He initially presented to [facility] and was admitted and treated for hypertensive emergency.  Yesterday he underwent MRI.  He was discharged from the hospital late last night.  He was advised to start taking new medications which she has not been able to pick up from the pharmacy.  He was also advised to follow-up with neurology.  After he left the hospital, he went home.  Patient lives alone in an apartment.  He has had a difficult time ambulating and driving.  He describes a diminished sensation in his left leg.  When he tries to walk, he falls towards his left side.  When he was driving, he was not able to tell if he had depressed the clutch.  He also describes a involuntary flexion of his left knee.  Although symptoms are most pronounced in his left leg, he endorses a mild numbness sensation and paresthesias to his left arm.  These are similar symptoms that he has had over the past 3 days.  He does feel that they are worsening.  He denies any other symptoms of concern.     Home Medications Prior to Admission medications   Medication Sig Start Date End Date Taking? Authorizing Provider  amLODipine (NORVASC) 10 MG tablet Take 1 tablet (10 mg total) by mouth daily. 09/08/22  Yes Johnson, Clanford L, MD  lansoprazole (PREVACID) 30 MG capsule Take 1 capsule (30 mg total) by mouth daily. 09/08/22 09/08/23 Yes Johnson, Clanford L, MD  meclizine (ANTIVERT) 25 MG tablet Take 25 mg by mouth every 8 (eight) hours as needed for dizziness. 09/02/22  Yes [provider]  aspirin 81 MG chewable tablet Chew 1 tablet (81 mg total) by mouth daily. 09/08/22   Johnson, Clanford L, MD  atorvastatin (LIPITOR) 40 MG tablet Take 1 tablet (40 mg  total) by mouth every evening. 09/08/22   Johnson, Clanford L, MD  clopidogrel (PLAVIX) 75 MG tablet Take 1 tablet (75 mg total) by mouth daily for 21 days. 09/08/22 09/29/22  Johnson, Clanford L, MD  folic acid (FOLVITE) 1 MG tablet Take 1 tablet (1 mg total) by mouth daily. 09/09/22   Johnson, Clanford L, MD  losartan (COZAAR) 50 MG tablet Take 1 tablet (50 mg total) by mouth daily. 09/09/22   Johnson, Clanford L, MD  Multiple Vitamin (MULTIVITAMIN WITH MINERALS) TABS tablet Take 1 tablet by mouth daily. 09/09/22   Johnson, Clanford L, MD  thiamine (VITAMIN B-1) 100 MG tablet Take 1 tablet (100 mg total) by mouth daily. 09/09/22   Murlean Iba, MD      Allergies    Patient has no known allergies.    Review of Systems   Review of Systems  Neurological:  Positive for numbness.  All other systems reviewed and are negative.   Physical Exam Updated Vital Signs BP (!) 176/79 (BP Location: Left Arm)   Pulse 60   Temp 97.7 F (36.5 C) (Oral)   Resp 17   Ht 5\' 7"  (1.702 m)   Wt 63.5 kg   SpO2 98%   BMI 21.93 kg/m  Physical Exam Vitals and nursing note reviewed.  Constitutional:      General: He is not  in acute distress.    Appearance: Normal appearance. He is well-developed. He is not ill-appearing, toxic-appearing or diaphoretic.  HENT:     Head: Normocephalic and atraumatic.     Right Ear: External ear normal.     Left Ear: External ear normal.     Nose: Nose normal.     Mouth/Throat:     Mouth: Mucous membranes are moist.     Pharynx: Oropharynx is clear.  Eyes:     Extraocular Movements: Extraocular movements intact.     Conjunctiva/sclera: Conjunctivae normal.  Cardiovascular:     Rate and Rhythm: Normal rate and regular rhythm.  Pulmonary:     Effort: Pulmonary effort is normal. No respiratory distress.  Abdominal:     General: There is no distension.     Palpations: Abdomen is soft.     Tenderness: There is no abdominal tenderness.  Musculoskeletal:         General: No swelling, tenderness or deformity. Normal range of motion.     Cervical back: Normal range of motion and neck supple.     Right lower leg: No edema.     Left lower leg: No edema.  Skin:    General: Skin is warm and dry.     Coloration: Skin is not jaundiced or pale.  Neurological:     Mental Status: He is alert and oriented to person, place, and time.     Cranial Nerves: No cranial nerve deficit, dysarthria or facial asymmetry.     Sensory: Sensory deficit (Diminished sensation in left hemibody) present.     Motor: Motor function is intact. No weakness, abnormal muscle tone or pronator drift.     Coordination: Coordination is intact. Finger-Nose-Finger Test and Heel to [facility] Test normal.  Psychiatric:        Mood and Affect: Mood normal.        Behavior: Behavior normal.        Thought Content: Thought content normal.        Judgment: Judgment normal.     ED Results / Procedures / Treatments   Labs (all labs ordered are listed, but only abnormal results are displayed) Labs Reviewed - No data to display  EKG None  Radiology CT ANGIO HEAD NECK W WO CM  Result Date: 09/09/2022 CLINICAL DATA:  Provided history: Stroke/TIA, determine embolic source. Additional history provided: Left leg numbness. EXAM: CT ANGIOGRAPHY HEAD AND NECK TECHNIQUE: Multidetector CT imaging of the head and neck was performed using the standard protocol during bolus administration of intravenous contrast. Multiplanar CT image reconstructions and MIPs were obtained to evaluate the vascular anatomy. Carotid stenosis measurements (when applicable) are obtained utilizing NASCET criteria, using the distal internal carotid diameter as the denominator. RADIATION DOSE REDUCTION: This exam was performed according to the departmental dose-optimization program which includes automated exposure control, adjustment of the mA and/or kV according to patient size and/or use of iterative reconstruction technique.  CONTRAST:  63mL OMNIPAQUE IOHEXOL 350 MG/ML SOLN COMPARISON:  MRI brain and MRA head 09/08/2022. FINDINGS: CT HEAD FINDINGS Brain: No age advanced or lobar predominant parenchymal atrophy. The acute/early subacute infarcts within the dorsal right pons, right thalamus and right cerebral white matter demonstrated on yesterday's brain MRI are occult by CT. Chronic small-vessel infarct within the right corona radiata/basal ganglia. Background mild patchy and ill-defined hypoattenuation within the cerebral white matter, nonspecific but compatible with chronic small vessel disease. There is no acute intracranial hemorrhage. No extra-axial fluid collection. No evidence of an  intracranial mass. No midline shift. Vascular: No hyperdense vessel. Atherosclerotic calcifications. Skull: No fracture or aggressive osseous lesion. Sinuses/Orbits: No orbital mass or acute orbital finding. No significant paranasal sinus disease. Other: Redemonstrated 2.1 cm subcutaneous cystic lesion within the posterior right upper neck. Review of the MIP images confirms the above findings CTA NECK FINDINGS Aortic arch: Common origin of the innominate and left common carotid arteries. Atherosclerotic plaque within the visualized aortic arch and proximal major branch vessels of the neck. Streak and beam hardening artifact arising from a dense right-sided contrast bolus partially obscures the right subclavian artery. Within this limitation, there is no appreciable hemodynamically significant innominate or proximal subclavian artery stenosis. Right carotid system: CCA and ICA patent within the neck without stenosis. Minimal soft plaque within Left carotid system: The carotid bulb. CCA and ICA patent within the neck without stenosis. Mild osteophyte plaque about the carotid bifurcation. Vertebral arteries: The vertebral arteries are patent within the neck without stenosis. The left vertebral artery is slightly dominant. Skeleton: Cervical spondylosis.  Disc space narrowing is advanced at C5-C6. No acute fracture or aggressive osseous lesion. Other neck: Redemonstrated 2.1 cm subcutaneous cystic lesion within the posterior right upper neck. Upper chest: Streak and beam hardening artifact arising from a dense right-sided contrast bolus limits evaluation of the imaged lung apices. Within this limitation, there is no appreciable airspace consolidation at the imaged levels. Review of the MIP images confirms the above findings CTA HEAD FINDINGS Anterior circulation: The intracranial internal carotid arteries are patent. Non-stenotic atherosclerotic plaque within both vessels. The M1 middle cerebral arteries are patent. No M2 proximal branch occlusion or high-grade proximal stenosis. The anterior cerebral arteries are patent. No intracranial aneurysm is identified. Posterior circulation: The intracranial vertebral arteries are patent. The basilar artery is patent. The posterior cerebral arteries are patent. Posterior communicating arteries are present bilaterally. Venous sinuses: Within the limitations of contrast timing, no convincing thrombus. Anatomic variants: None significant. Review of the MIP images confirms the above findings IMPRESSION: CT head: 1. The acute/early subacute infarcts within the dorsal right pons, right thalamus and right cerebral white matter demonstrated on yesterday's brain MRI are occult by CT. 2. No CT evidence of interval acute intracranial abnormality. 3. Redemonstrated chronic small-vessel infarct within the right corona radiata/basal ganglia. 4. Background mild cerebral white matter chronic small vessel ischemic disease. CTA neck: 1. The common carotid and internal carotid arteries are patent within the neck without stenosis. Mild atherosclerotic plaque, bilaterally. 2. The vertebral arteries are patent within the neck without stenosis or significant atherosclerotic disease. 3. Aortic Atherosclerosis (ICD10-I70.0). CTA head: 1. No  intracranial large vessel occlusion or proximal high-grade arterial stenosis identified. 2. Non-stenotic atherosclerotic plaque within the intracranial ICAs, bilaterally. Electronically Signed   By: Kellie Simmering D.O.   On: 09/09/2022 15:31   MR BRAIN WO CONTRAST  Result Date: 09/08/2022 CLINICAL DATA:  Provided history: Neuro deficit, acute, stroke suspected. EXAM: MRI HEAD WITHOUT CONTRAST MRA HEAD WITHOUT CONTRAST TECHNIQUE: Multiplanar, multi-echo pulse sequences of the brain and surrounding structures were acquired without intravenous contrast. Angiographic images of the Circle of Willis were acquired using MRA technique without intravenous contrast. COMPARISON:  Prior head CT examinations 09/06/2022 and earlier. FINDINGS: MRI HEAD FINDINGS Brain: No age advanced or lobar predominant parenchymal atrophy. Multiple recent infarcts, as follows. 7 mm acute infarct within the dorsal right pons (series 5, image 9). 4 mm acute infarct within the right thalamus (series 5, image 18). 4 mm acute/early subacute infarct within the right  frontoparietal periventricular white matter (series 7, image 12) (series 5, image 24). 6 mm acute/early subacute infarct within the posterior right frontal lobe white matter (series 5, image 28). 4 mm acute/early subacute infarct within the anterior right subinsular white matter (series 5, image 19). Chronic small-vessel infarct within the right corona radiata/basal ganglia. Chronic lacunar infarcts elsewhere within the bilateral deep gray nuclei. Background mild multifocal T2 FLAIR hyperintense signal abnormality within the cerebral white matter, nonspecific but compatible with chronic small vessel disease. No evidence of an intracranial mass. No chronic intracranial blood products. No extra-axial fluid collection. No midline shift. Vascular: Maintained flow voids within the proximal large arterial vessels. Skull and upper cervical spine: No focal suspicious marrow lesion.  Sinuses/Orbits: No mass or acute finding within the imaged orbits. No significant paranasal sinus disease. Other: Trace fluid within the left mastoid air cells. 1.6 cm lesion within the posterior right upper neck with associated restricted diffusion, nonspecific but likely reflecting an epidermal inclusion cyst. MRA HEAD FINDINGS Anterior circulation: The intracranial internal carotid arteries are patent. The M1 middle cerebral arteries are patent. Atherosclerotic irregularity of the M2 and more distal MCA vessels, bilaterally. No M2 proximal branch occlusion or high-grade proximal stenosis is identified. The anterior cerebral arteries are patent. No intracranial aneurysm is identified. Posterior circulation: The intracranial vertebral arteries are patent. The basilar artery is patent. The posterior cerebral arteries are patent. Moderate/severe focal stenosis within the right PCA at P2/P3 junction. Moderate/severe focal stenosis within the left PCA at the P2/P3 junction. Posterior communicating arteries are present bilaterally. Anatomic variants: None significant IMPRESSION: MRI brain: 1. 7 mm acute infarct within the dorsal right pons. 2. 4 mm acute infarct within the right thalamus. 3. 4 mm acute/early subacute infarct within the right frontoparietal periventricular white matter. 4. 6 mm acute/early subacute infarct within the posterior right frontal lobe white matter. 5. 4 mm acute/early subacute infarct within the anterior right subinsular white matter 6. Given that the above described infarcts involve multiple vascular territories, consider an embolic process. 7. Background chronic small vessel ischemic disease with multiple chronic small-vessel infarcts, as described. MRA head: 1. No intracranial large vessel occlusion is identified. 2. Intracranial atherosclerotic disease. Most notably, there are apparent moderate/severe stenoses within the bilateral posterior cerebral arteries at the P2/P3 junctions.  Electronically Signed   By: Kellie Simmering D.O.   On: 09/08/2022 14:09   MR ANGIO HEAD WO CONTRAST  Result Date: 09/08/2022 CLINICAL DATA:  Provided history: Neuro deficit, acute, stroke suspected. EXAM: MRI HEAD WITHOUT CONTRAST MRA HEAD WITHOUT CONTRAST TECHNIQUE: Multiplanar, multi-echo pulse sequences of the brain and surrounding structures were acquired without intravenous contrast. Angiographic images of the Circle of Willis were acquired using MRA technique without intravenous contrast. COMPARISON:  Prior head CT examinations 09/06/2022 and earlier. FINDINGS: MRI HEAD FINDINGS Brain: No age advanced or lobar predominant parenchymal atrophy. Multiple recent infarcts, as follows. 7 mm acute infarct within the dorsal right pons (series 5, image 9). 4 mm acute infarct within the right thalamus (series 5, image 18). 4 mm acute/early subacute infarct within the right frontoparietal periventricular white matter (series 7, image 12) (series 5, image 24). 6 mm acute/early subacute infarct within the posterior right frontal lobe white matter (series 5, image 28). 4 mm acute/early subacute infarct within the anterior right subinsular white matter (series 5, image 19). Chronic small-vessel infarct within the right corona radiata/basal ganglia. Chronic lacunar infarcts elsewhere within the bilateral deep gray nuclei. Background mild multifocal T2 FLAIR hyperintense signal  abnormality within the cerebral white matter, nonspecific but compatible with chronic small vessel disease. No evidence of an intracranial mass. No chronic intracranial blood products. No extra-axial fluid collection. No midline shift. Vascular: Maintained flow voids within the proximal large arterial vessels. Skull and upper cervical spine: No focal suspicious marrow lesion. Sinuses/Orbits: No mass or acute finding within the imaged orbits. No significant paranasal sinus disease. Other: Trace fluid within the left mastoid air cells. 1.6 cm lesion  within the posterior right upper neck with associated restricted diffusion, nonspecific but likely reflecting an epidermal inclusion cyst. MRA HEAD FINDINGS Anterior circulation: The intracranial internal carotid arteries are patent. The M1 middle cerebral arteries are patent. Atherosclerotic irregularity of the M2 and more distal MCA vessels, bilaterally. No M2 proximal branch occlusion or high-grade proximal stenosis is identified. The anterior cerebral arteries are patent. No intracranial aneurysm is identified. Posterior circulation: The intracranial vertebral arteries are patent. The basilar artery is patent. The posterior cerebral arteries are patent. Moderate/severe focal stenosis within the right PCA at P2/P3 junction. Moderate/severe focal stenosis within the left PCA at the P2/P3 junction. Posterior communicating arteries are present bilaterally. Anatomic variants: None significant IMPRESSION: MRI brain: 1. 7 mm acute infarct within the dorsal right pons. 2. 4 mm acute infarct within the right thalamus. 3. 4 mm acute/early subacute infarct within the right frontoparietal periventricular white matter. 4. 6 mm acute/early subacute infarct within the posterior right frontal lobe white matter. 5. 4 mm acute/early subacute infarct within the anterior right subinsular white matter 6. Given that the above described infarcts involve multiple vascular territories, consider an embolic process. 7. Background chronic small vessel ischemic disease with multiple chronic small-vessel infarcts, as described. MRA head: 1. No intracranial large vessel occlusion is identified. 2. Intracranial atherosclerotic disease. Most notably, there are apparent moderate/severe stenoses within the bilateral posterior cerebral arteries at the P2/P3 junctions. Electronically Signed   By: Kellie Simmering D.O.   On: 09/08/2022 14:09    Procedures Procedures    Medications Ordered in ED Medications  LORazepam (ATIVAN) injection 0.5 mg  (has no administration in time range)  iohexol (OMNIPAQUE) 350 MG/ML injection 75 mL (75 mLs Intravenous Contrast Given 09/09/22 1457)    ED Course/ Medical Decision Making/ A&P                           Medical Decision Making Amount and/or Complexity of Data Reviewed Radiology: ordered.  Risk Prescription drug management.   This patient presents to the ED for concern of left leg numbness, this involves an extensive number of treatment options, and is a complaint that carries with it a high risk of complications and morbidity.  The differential diagnosis includes evolution of symptoms from recent stroke, progression of recent CVA, hemorrhagic conversion of recent CVA   Co morbidities that complicate the patient evaluation  Heavy alcohol use, GERD, HTN, CVA, chronic back pain   Additional history obtained:  Additional history obtained from N/A External records from outside source obtained and reviewed including EMR   Lab Tests:  Lab work from yesterday showed normal kidney function, normal electrolytes, baseline elevation in transaminases, normal hemoglobin, no leukocytosis, baseline thrombocytopenia   Imaging Studies ordered:  I ordered imaging studies including CTA head and neck, MRI brain I independently visualized and interpreted imaging which showed (results pending at time of signout) I agree with the radiologist interpretation   Cardiac Monitoring: / EKG:  The patient was maintained on a  cardiac monitor.  I personally viewed and interpreted the cardiac monitored which showed an underlying rhythm of: Sinus rhythm   Consultations Obtained:  I requested consultation with the neurologist, Dr. Lorrin Goodell,  and discussed lab and imaging findings as well as pertinent plan - they recommend: CTA of head and neck, MRI   Problem List / ED Course / Critical interventions / Medication management  Patient is a pleasant 66 year old male who presents for persistent left leg  numbness.  He was recently admitted to [facility] following the onset of the symptoms.  He underwent MRI imaging yesterday which did reveal recent strokes.  Additional work-up at [facility] included echocardiogram and MRA of head.  He was discharged from the hospital late last night.  Following discharge, he has had difficulty ambulating, driving, and doing his normal functional activities.  For this reason, he presents to the ED today.  Vital signs are normal on arrival.  Patient is well-appearing on exam.  When discussing his recent hospitalization and testing, it does not appear that patient comprehended that he had had recent strokes.  This was news to him.  He was started on aspirin and Plavix but he has not picked up these prescriptions from the pharmacy yet.  On exam, he endorses diminished sensation throughout his left leg.  There does not appear to be any focal weakness.  Coordination of left lower extremity is intact with normal heel-to-shin maneuver.  He endorses some very mild diminished sensation in his left arm.  LUE strength is also intact.  He has no other neurologic findings on exam.  I discussed this with neurologist on-call, Dr. Lorrin Goodell who also reviewed recent work-up.  Recommendation is to obtain CTA of head and neck and a repeat MRI of brain to assess for possible progression of recent strokes. Care patient was signed out to oncoming ED provider.         Final Clinical Impression(s) / ED Diagnoses Final diagnoses:  Left leg numbness    Rx / DC Orders ED Discharge Orders     None         Godfrey Pick, MD 09/09/22 1540

## 2022-09-09 NOTE — ED Triage Notes (Signed)
Pt. States Ive had left leg numbness since last Thursday . I can walk but its like Im stumbling. Seen at [facility] for the same last Thursday. MRI yesterday and suppose to follow up with the neurologist. No idea who???

## 2022-09-09 NOTE — ED Notes (Signed)
Pt states that he wants to leave AMA. MD Alvino Chapel made aware.

## 2022-09-11 ENCOUNTER — Ambulatory Visit: Payer: Medicare HMO | Attending: Internal Medicine | Admitting: Internal Medicine

## 2022-09-11 ENCOUNTER — Encounter: Payer: Self-pay | Admitting: Internal Medicine

## 2022-09-11 ENCOUNTER — Ambulatory Visit: Payer: Medicare HMO | Admitting: Internal Medicine

## 2022-09-11 VITALS — BP 118/60 | HR 73 | Ht 67.0 in | Wt 145.4 lb

## 2022-09-11 DIAGNOSIS — I639 Cerebral infarction, unspecified: Secondary | ICD-10-CM

## 2022-09-11 NOTE — Progress Notes (Signed)
Cardiology Office Note   Date:  09/11/2022   ID:  Sherril, Heyward [birth year], [name]  PCP:  Lemmie Evens, MD  Cardiologist:   Dorris Carnes, MD   Pt presents for following HTN     History of Present Illness: Randy Ashley is a 66 y.o. male with a history of HTN, CVA, alcohol abuse   The pt was recently diagnosed with HTN and started on low dose amlodipine     He was admitted on 09/08/22 with L sided numbness.    BP was severely elevated       The pt had an MRI that showed multiple punctate lesions   Echo done with bubble study     LVEF normal   Bubble study negative      SInce d/c the pt says he has felt overall OK at rest.    BP is better    Still numb on left side    Does note some dizziness with walking   Unsteady     No CP      Current Meds  Medication Sig   amLODipine (NORVASC) 10 MG tablet Take 1 tablet (10 mg total) by mouth daily.   atorvastatin (LIPITOR) 40 MG tablet Take 1 tablet (40 mg total) by mouth every evening.   clopidogrel (PLAVIX) 75 MG tablet Take 1 tablet (75 mg total) by mouth daily for 21 days.   folic acid (FOLVITE) 1 MG tablet Take 1 tablet (1 mg total) by mouth daily.   lansoprazole (PREVACID) 30 MG capsule Take 1 capsule (30 mg total) by mouth daily.   losartan (COZAAR) 50 MG tablet Take 1 tablet (50 mg total) by mouth daily.     Allergies:   Patient has no known allergies.   Past Medical History:  Diagnosis Date   Chronic back pain    Hypertension    Lumbar radiculopathy     Past Surgical History:  Procedure Laterality Date   HERNIA REPAIR       Social History:  The patient  reports that he has been smoking cigarettes. He has been smoking an average of 1 pack per day. He has never used smokeless tobacco. He reports that he does not currently use alcohol. He reports that he does not use drugs.   Family History:  The patient's family history includes Arthritis in an other family member; Diabetes in an other family member;  Heart disease in an other family member.    ROS:  Please see the history of present illness. All other systems are reviewed and  Negative to the above problem except as noted.    PHYSICAL EXAM: VS:  BP 118/60   Pulse 73   Ht 5\' 7"  (1.702 m)   Wt 145 lb 6.4 oz (66 kg)   SpO2 98%   BMI 22.77 kg/m   GEN: Well nourished, well developed, in no acute distress  HEENT: normal  Neck: no JVD, carotid bruit Cardiac: RRR; no murmurs  No LE edema  Respiratory:  clear to auscultation bilaterally GI: soft, nontender, nondistended, + BS  No hepatomegaly  MS: no deformity Moving all extremities   Skin: warm and dry, no rash Neuro: L face weak    Numb L leg and arm  Gait   Shuffling, very unsteady   Otherwise deferred   Psych: euthymic mood, full affect   EKG:  EKG is not  ordered today.   Lipid Panel    Component Value Date/Time   CHOL  133 09/08/2022 1327   TRIG 157 (H) 09/08/2022 1327   HDL 23 (L) 09/08/2022 1327   CHOLHDL 5.8 09/08/2022 1327   VLDL 31 09/08/2022 1327   LDLCALC 79 09/08/2022 1327      Wt Readings from Last 3 Encounters:  09/11/22 145 lb 6.4 oz (66 kg)  09/09/22 140 lb (63.5 kg)  09/08/22 143 lb 8.3 oz (65.1 kg)      ASSESSMENT AND PLAN:  1  HTN  BP is OK today   I would keep on current regimen  2   CVA  REcent CVA   MRI sugg embolic    WIll set up for an event monitor    Will also dissuss with neuro TEE.  (Bubble study not the most sensitive)  3  Dizziness  Pt is very unsteady on feet   Needs PT/OT   Neuro follow up  4 Lipids    Keep  on atorvastatin     Current medicines are reviewed at length with the patient today.  The patient does not have concerns regarding medicines.  Signed, Dietrich Pates, MD  09/11/2022 2:35 PM    [facility] [address] [place], [place], [place]  [address] Phone: [phone]; Fax: [phone]

## 2022-09-11 NOTE — Patient Instructions (Signed)
Medication Instructions:  Your physician recommends that you continue on your current medications as directed. Please refer to the Current Medication list given to you today.  *If you need a refill on your cardiac medications before your next appointment, please call your pharmacy*   Lab Work: NONE   If you have labs (blood work) drawn today and your tests are completely normal, you will receive your results only by: [place] (if you have MyChart) OR A paper copy in the mail If you have any lab test that is abnormal or we need to change your treatment, we will call you to review the results.   Testing/Procedures: NONE    Follow-Up: At [facility], you and your health needs are our priority.  As part of our continuing mission to provide you with exceptional heart care, we have created designated Provider Care Teams.  These Care Teams include your primary Cardiologist (physician) and Advanced Practice Providers (APPs -  Physician Assistants and Nurse Practitioners) who all work together to provide you with the care you need, when you need it.  We recommend signing up for the patient portal called "MyChart".  Sign up information is provided on this After Visit Summary.  MyChart is used to connect with patients for Virtual Visits (Telemedicine).  Patients are able to view lab/test results, encounter notes, upcoming appointments, etc.  Non-urgent messages can be sent to your provider as well.   To learn more about what you can do with MyChart, go to [email or web address].    Your next appointment:    February   The format for your next appointment:   In Person  Provider:   Dorris Carnes, MD    Other Instructions Thank you for choosing [facility]!     Important Information About Sugar

## 2022-09-12 ENCOUNTER — Telehealth: Payer: Self-pay | Admitting: Internal Medicine

## 2022-09-12 NOTE — Telephone Encounter (Signed)
Pt states that at visit yesterday Dr. Harrington Challenger ordered for hi to have a BP Monitor as well as a cane. Pt states that his insurance is saying that they are needing something from Dr. Harrington Challenger before approving. Pt would like a callback regarding this matter. Please advise

## 2022-09-15 ENCOUNTER — Telehealth: Payer: Self-pay | Admitting: Internal Medicine

## 2022-09-15 NOTE — Telephone Encounter (Signed)
Patient has appointment for nurse visit on 10/10 for monitor placement.   Pt stated that provider would have to call [employer] and authorize prescription for cane/bp cuff.   [facility] Medicare Provider Call Line: [phone]

## 2022-09-15 NOTE — Telephone Encounter (Signed)
Patient's wife calling to see if he can have the monitor put on. She says she is also calling about prescriptions for a cane and a BP cuff. She says the patient received prescriptions, but when they took it to the [facility] they said it has to be sent to [facility] and it can be mailed to him.

## 2022-09-16 ENCOUNTER — Ambulatory Visit: Payer: Medicare HMO

## 2022-09-16 DIAGNOSIS — I639 Cerebral infarction, unspecified: Secondary | ICD-10-CM | POA: Diagnosis not present

## 2022-09-30 ENCOUNTER — Ambulatory Visit: Payer: Medicare HMO | Attending: Cardiology

## 2022-09-30 DIAGNOSIS — I639 Cerebral infarction, unspecified: Secondary | ICD-10-CM

## 2022-10-02 NOTE — Addendum Note (Signed)
Addended by: Levonne Hubert on: 10/02/2022 04:07 PM   Modules accepted: Orders

## 2022-10-09 NOTE — Therapy (Signed)
OUTPATIENT PHYSICAL THERAPY NEURO EVALUATION   Patient Name: Randy Ashley MRN: [id number] DOB:Dec 08, 1956, 66 y.o., male Today's Date: 10/10/2022   PCP: Dr. Sudie Bailey REFERRING PROVIDER: Dr. Dietrich Pates   PT End of Session - 10/10/22 1112     Visit Number 1    Number of Visits 12    Date for PT Re-Evaluation 11/21/22    Authorization Type humana    Progress Note Due on Visit 10    PT Start Time 0815    PT Stop Time 0900    PT Time Calculation (min) 45 min    Activity Tolerance Patient tolerated treatment well    Behavior During Therapy [facility] for tasks assessed/performed             Past Medical History:  Diagnosis Date   Chronic back pain    Hypertension    Lumbar radiculopathy    Past Surgical History:  Procedure Laterality Date   HERNIA REPAIR     Patient Active Problem List   Diagnosis Date Noted   Hypertensive emergency 09/06/2022   Acute CVA (cerebrovascular accident) (HCC) 09/06/2022   Uncontrolled hypertension 09/06/2022   Smoker 09/06/2022   Heavy alcohol consumption 09/06/2022   Elevated LFTs 09/06/2022   Thrombocytopenia (HCC) 09/06/2022   Hyperglycemia 09/06/2022   GERD (gastroesophageal reflux disease) 09/06/2022   CLOSED FRACTURE OF BASE OF OTHER METACARPAL BONE 02/10/2011    ONSET DATE: 09/06/2022  REFERRING DIAG:  Diagnosis  I63.9 (ICD-10-CM) - Acute CVA (cerebrovascular accident) (HCC)   THERAPY DIAG:  Muscle weakness  Rationale for Evaluation and Treatment: Rehabilitation  SUBJECTIVE:                                                                                                                                                                                             SUBJECTIVE STATEMENT:  PT states that he had a stroke about two weeks ago.   Prior to the stroke he was not using an assistive device.  He feels numb on his left side now.  He is able to walk for about 10 minutes where before he could walk as long as he wanted to.    Pt  accompanied by: self  PERTINENT HISTORY: 66 year old gentleman with recently diagnosed hypertension, daily alcohol consumer, smoker, GERD presents to the ED complaining of left-sided numbness since yesterday morning.  He reportedly was recently diagnosed with hypertension and started on amlodipine 2.5 mg daily from an ED visit on August 05, 2022.  He says he has not been taking it regularly.  He reported that yesterday morning after a shower he started to feel the symptoms of  left-sided numbness.  The symptoms have persisted prompting his visit.  He denies chest pain.  He denies shortness of breath.  He denies vision changes.  He denies rash.  He reportedly has been smoking cigarettes since age 83.  His blood pressure was notably elevated on arrival with a SBP up to 217.  He had a CT brain with no acute findings.  Neurology was consulted and felt that patient can safely stay at [facility].       PAIN:  Are you having pain? No  PRECAUTIONS: Fall  WEIGHT BEARING RESTRICTIONS: No  FALLS: Has patient fallen in last 6 months? No  LIVING ENVIRONMENT: Lives with: lives with their spouse Lives in: House/apartment Stairs: No Has following equipment at home: Single point cane pt was not using the can prior to the stroke.   PLOF: Independent  PATIENT GOALS: TO be able to walk without his cane, to get his LT side better.   OBJECTIVE:   DIAGNOSTIC FINDINGS: IMPRESSION: MRI brain:   1. 7 mm acute infarct within the dorsal right pons. 2. 4 mm acute infarct within the right thalamus. 3. 4 mm acute/early subacute infarct within the right frontoparietal periventricular white matter. 4. 6 mm acute/early subacute infarct within the posterior right frontal lobe white matter. 5. 4 mm acute/early subacute infarct within the anterior right subinsular white matter 6. Given that the above described infarcts involve multiple vascular territories, consider an embolic process. 7. 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   MRA head:   1. No intracranial large vessel occlusion is identified. 2. Intracranial atherosclerotic disease. Most notably, there are apparent moderate/severe stenoses within the bilateral posterior cerebral arteries at the P2/P3 junctions.     Electronically Signed   By: Kellie Simmering D.O.   On: 09/08/2022 14:09  COGNITION: Overall cognitive status: Within functional limits for tasks assessed   SENSATION: Not tested   POSTURE: rounded shoulders, forward head, decreased lumbar lordosis, and increased thoracic kyphosis    LOWER EXTREMITY MMT:    MMT Right Eval Left Eval  Hip flexion 5/5 3-/5  Hip extension 5/5 4/5  Hip abduction 5/5 3+/5  Hip adduction    Hip internal rotation    Hip external rotation    Knee flexion 5/5 5/5  Knee extension 5/5 4/5  Ankle dorsiflexion 5/5 4/5  Ankle plantarflexion    Ankle inversion    Ankle eversion    (Blank rows = not tested)  BED MOBILITY:  I   FUNCTIONAL TESTS:  30 seconds chair stand test:  7 2 minute walk test: 255' Single leg stance:  RT:  0"  , Lt:  7"  PATIENT SURVEYS:  FOTO 46  TODAY'S TREATMENT:                                                                                                                              DATE: 10/10/2022 Evaluation:  sit to stand 10                    Slr x 10                    Bridge x 10                    Lt hip abduction x 10                         PATIENT EDUCATION: Education details: HEP Person educated: Patient Education method: Chief Technology Officer Education comprehension: returned demonstration  HOME EXERCISE PROGRAM: Access Code: [email or web address] URL: [email or web address] Date: 10/10/2022 Prepared by: Virgina Organ  Exercises - Sit to Stand with Armchair  - 3 x daily - 7 x weekly - 1 sets - 10 reps - 3" hold - Active Straight Leg Raise with Quad Set  - 3 x  daily - 7 x weekly - 1 sets - 10 reps - 3" hold - Supine Bridge  - 3 x daily - 7 x weekly - 1 sets - 10 reps - 3" hold             - Sidelying Hip Abduction  - 3 x daily - 7 x weekly - 1 sets - 10 reps - 3" hold   GOALS: Goals reviewed with patient? No  SHORT TERM GOALS: Target date: 10/24/2022  Pt to be I in HEP to improve strength by 1/2 grade to be able to go up and down 4 steps with a hand rail  Baseline: Goal status: INITIAL  2.  Pt to be able to single leg stance B x 10 seconds to be able to walk indoor without assistive device to reduce risk of falling.  Baseline:  Goal status: INITIAL    LONG TERM GOALS: Target date: 11/07/2022  PT to be I in an advanced HEP in order to increase mm strength one grade to be abel to go up and down 4 steps in a reciprocal manner.   Baseline:  Goal status: INITIAL  2.  Pt to improve balance to feel confident walking outside without an assistive device  Baseline:  Goal status: INITIAL    ASSESSMENT:  CLINICAL IMPRESSION: Patient is a 66 y.o. male who was seen today for physical therapy evaluation and treatment for CVA. Evaluation demonstrates decreased strength, decreased balance, and decreased activity tolerance.  Mr. Danielsen will benefit from skilled PT to return him to his prior level of functioning.   OBJECTIVE IMPAIRMENTS: cardiopulmonary status limiting activity, decreased activity tolerance, decreased balance, difficulty walking, and decreased strength.   ACTIVITY LIMITATIONS: carrying, lifting, standing, squatting, and locomotion level  PARTICIPATION LIMITATIONS: cleaning, shopping, and community activity  PERSONAL FACTORS: 2 comorbidity: HTN, smoking are also affecting patient's functional outcome.   REHAB POTENTIAL: Good  CLINICAL DECISION MAKING: Stable/uncomplicated  EVALUATION COMPLEXITY: Low  PLAN:  PT FREQUENCY: 2x/week  PT DURATION: 6 weeks  PLANNED INTERVENTIONS: Therapeutic exercises, Therapeutic activity,  Neuromuscular re-education, Balance training, Gait training, Patient/Family education, and Self Care  PLAN FOR NEXT SESSION: begin balance and wt bearing strengthening exercises.    Virgina Organ, PT CLT [phone]  12:13

## 2022-10-10 ENCOUNTER — Ambulatory Visit (HOSPITAL_COMMUNITY): Payer: Medicare HMO | Attending: Internal Medicine | Admitting: Physical Therapy

## 2022-10-10 ENCOUNTER — Ambulatory Visit (HOSPITAL_COMMUNITY): Payer: Medicare HMO | Admitting: Occupational Therapy

## 2022-10-10 DIAGNOSIS — M6281 Muscle weakness (generalized): Secondary | ICD-10-CM | POA: Insufficient documentation

## 2022-10-10 DIAGNOSIS — R262 Difficulty in walking, not elsewhere classified: Secondary | ICD-10-CM | POA: Insufficient documentation

## 2022-10-10 DIAGNOSIS — I639 Cerebral infarction, unspecified: Secondary | ICD-10-CM | POA: Diagnosis not present

## 2022-10-10 DIAGNOSIS — R278 Other lack of coordination: Secondary | ICD-10-CM

## 2022-10-10 DIAGNOSIS — M25612 Stiffness of left shoulder, not elsewhere classified: Secondary | ICD-10-CM | POA: Diagnosis present

## 2022-10-10 DIAGNOSIS — R29818 Other symptoms and signs involving the nervous system: Secondary | ICD-10-CM | POA: Diagnosis present

## 2022-10-10 NOTE — Therapy (Unsigned)
OUTPATIENT OCCUPATIONAL THERAPY NEURO EVALUATION  Patient Name: Randy Ashley MRN: [id number] DOB:1955-12-11, 66 y.o., male Today's Date: 10/12/2022  PCP: Gareth Morgan, MD REFERRING PROVIDER: Dietrich Pates, MD   OT End of Session - 10/12/22 2105     Visit Number 1    Number of Visits 5    Date for OT Re-Evaluation 11/04/22    Authorization Type [employer] Medicare, $20 copay    Authorization Time Period No visit limit    OT Start Time 0925    OT Stop Time 1010    OT Time Calculation (min) 45 min    Activity Tolerance Patient tolerated treatment well    Behavior During Therapy [name] for tasks assessed/performed             Past Medical History:  Diagnosis Date   Chronic back pain    Hypertension    Lumbar radiculopathy    Past Surgical History:  Procedure Laterality Date   HERNIA REPAIR     Patient Active Problem List   Diagnosis Date Noted   Hypertensive emergency 09/06/2022   Acute CVA (cerebrovascular accident) (HCC) 09/06/2022   Uncontrolled hypertension 09/06/2022   Smoker 09/06/2022   Heavy alcohol consumption 09/06/2022   Elevated LFTs 09/06/2022   Thrombocytopenia (HCC) 09/06/2022   Hyperglycemia 09/06/2022   GERD (gastroesophageal reflux disease) 09/06/2022   CLOSED FRACTURE OF BASE OF OTHER METACARPAL BONE 02/10/2011    ONSET DATE: 09/06/2022  REFERRING DIAG: R CVA with L sided weakness  THERAPY DIAG:  Stiffness of left shoulder, not elsewhere classified  Other lack of coordination  Other symptoms and signs involving the nervous system  Rationale for Evaluation and Treatment: Rehabilitation  SUBJECTIVE:   SUBJECTIVE STATEMENT: "I'm tired of feeling so weak all the time" Pt accompanied by: self  PERTINENT HISTORY: Patient lives alone and works at [employer]. PMH significant for HTN, GERD, smoking, and daily EToH consumption. Pt recently diagnosed with a R CVA.  PRECAUTIONS: Fall  WEIGHT BEARING RESTRICTIONS: No  PAIN:  Are you having  pain? No  FALLS: Has patient fallen in last 6 months? No  LIVING ENVIRONMENT: Lives with: lives alone Lives in: House/apartment  PLOF: Independent  PATIENT GOALS: I want to get back like I was.  OBJECTIVE:   HAND DOMINANCE: Left  ADLs: Overall ADLs: Limited with cooking and cleaning, difficulty with bathing tasks. Unable to work at this time.  MOBILITY STATUS:  Supervision with cane  POSTURE COMMENTS:  No Significant postural limitations Sitting balance: Moves/returns truncal midpoint >2 inches in all planes  ACTIVITY TOLERANCE: Activity tolerance: Limited due to strength and endurance  FUNCTIONAL OUTCOME MEASURES: FOTO: 51.17  UPPER EXTREMITY ROM:    Active ROM Left eval  Shoulder flexion 143  Shoulder abduction 136  Shoulder internal rotation 90  Shoulder external rotation 68  (Blank rows = not tested)  UPPER EXTREMITY MMT:     MMT Left eval  Shoulder flexion 4/5  Shoulder abduction 4+/5  Shoulder adduction 4/5  Shoulder extension 4/5  Shoulder internal rotation 5/5  Shoulder external rotation 5/5  Elbow flexion 5/5  Elbow extension 5/5  (Blank rows = not tested)  HAND FUNCTION: Grip strength: Right: 68 lbs; Left: 60 lbs, Lateral pinch: Right: 23 lbs, Left: 23 lbs, and 3 point pinch: Right: 18 lbs, Left: 16 lbs  COORDINATION: 9 Hole Peg test: Right: 29 sec; Left: 34 sec  SENSATION: Light touch: Impaired   EDEMA: No Edema noted  COGNITION: Overall cognitive status: Within functional limits for tasks  assessed  VISION: Subjective report: No vision changes since CVA Baseline vision: No visual deficits Visual history: brain injury  VISION ASSESSMENT: WFL  PERCEPTION: WFL  PRAXIS: WFL  OBSERVATIONS: Coordination with small items is slightly off due to numbness.   TODAY'S TREATMENT:                                                                                                                              DATE: 10/10/22: Evaluation only    PATIENT EDUCATION: Education details: Will provide on first session. Person educated: Patient Education method: Explanation Education comprehension: verbalized understanding  HOME EXERCISE PROGRAM: Will Provide on first session   GOALS: Goals reviewed with patient? Yes  SHORT TERM GOALS: Target date: 11/04/22  Pt will be educated on HEP to improve ability to actively use LUE during functional task completion. Goal status: INITIAL  2.  Pt will increase strength in LUE to 5/5 to improve ability to perform lifting tasks required during cooking and cleaning. Goal status: INITIAL  3.  Pt will increase fine motor coordination in LUE by completing the 9 hole peg test in under 30" to improve ability to perform dressing tasks such as operating buttons and zippers.  Goal status: INITIAL  4.  Pt will increase A/ROM in LUE to [facility] to improve ability to reach overhead and behind back during dressing and bathing tasks.  Goal status: INITIAL   ASSESSMENT:  CLINICAL IMPRESSION: Patient is a 66 y.o. Male who was seen today for occupational therapy evaluation for s/p R CVA. Pt presents to evaluation with mild L sided weakness, balance deficits, and sensation deficits. He reports severe numbness over all of his left side from his toes up to his face. These deficits slightly impact his coordination, strength, and dexterity. Overall he is requiring assist for fine motor tasks, as well as requiring increased time for all ADL's. He will benefit from skilled OT To maximize strength and coordination, in order to improve independence with all ADL's and IADL's.    PERFORMANCE DEFICITS: in functional skills including ADLs, IADLs, coordination, dexterity, sensation, ROM, strength, Fine motor control, body mechanics, endurance, and UE functional use.  IMPAIRMENTS: are limiting patient from ADLs, IADLs, rest and sleep, work, leisure, and social participation.   CO-MORBIDITIES: has no other co-morbidities  that affects occupational performance. Patient will benefit from skilled OT to address above impairments and improve overall function.  MODIFICATION OR ASSISTANCE TO COMPLETE EVALUATION: No modification of tasks or assist necessary to complete an evaluation.  OT OCCUPATIONAL PROFILE AND HISTORY: Problem focused assessment: Including review of records relating to presenting problem.  CLINICAL DECISION MAKING: LOW - limited treatment options, no task modification necessary  REHAB POTENTIAL: Excellent  EVALUATION COMPLEXITY: Low    PLAN:  OT FREQUENCY: 1x/week  OT DURATION: 4 weeks  PLANNED INTERVENTIONS: self care/ADL training, therapeutic exercise, therapeutic activity, neuromuscular re-education, manual therapy, passive range of motion, balance training, functional mobility training, electrical stimulation, ultrasound,  paraffin, moist heat, cryotherapy, patient/family education, and coping strategies training  RECOMMENDED OTHER SERVICES: N/A  CONSULTED AND AGREED WITH PLAN OF CARE: Patient  PLAN FOR NEXT SESSION: Coordination tasks, strengthening tasks, bilateral coordination tasks   Demarkis Gheen Bing Plume, OTR/L [facility] [phone] Kennyth Arnold, OT 10/12/2022, 9:09 PM

## 2022-10-12 ENCOUNTER — Encounter (HOSPITAL_COMMUNITY): Payer: Self-pay | Admitting: Occupational Therapy

## 2022-10-13 ENCOUNTER — Ambulatory Visit (HOSPITAL_COMMUNITY): Payer: Medicare HMO | Admitting: Physical Therapy

## 2022-10-15 ENCOUNTER — Ambulatory Visit (HOSPITAL_COMMUNITY): Payer: Medicare HMO | Admitting: Occupational Therapy

## 2022-10-15 DIAGNOSIS — M25612 Stiffness of left shoulder, not elsewhere classified: Secondary | ICD-10-CM

## 2022-10-15 DIAGNOSIS — R29818 Other symptoms and signs involving the nervous system: Secondary | ICD-10-CM

## 2022-10-15 DIAGNOSIS — R278 Other lack of coordination: Secondary | ICD-10-CM

## 2022-10-15 DIAGNOSIS — R262 Difficulty in walking, not elsewhere classified: Secondary | ICD-10-CM | POA: Diagnosis not present

## 2022-10-15 NOTE — Patient Instructions (Signed)
Perform each exercise ____10-15____ reps. 2-3x days.   1) Protraction   Start by holding a wand or cane at chest height.  Next, slowly push the wand outwards in front of your body so that your elbows become fully straightened. Then, return to the original position.     2) Shoulder FLEXION   In the standing position, hold a wand/cane with both arms, palms down on both sides. Raise up the wand/cane allowing your unaffected arm to perform most of the effort. Your affected arm should be partially relaxed.      3) Internal/External ROTATION   In the standing position, hold a wand/cane with both hands keeping your elbows bent. Move your arms and wand/cane to one side.  Your affected arm should be partially relaxed while your unaffected arm performs most of the effort.       4) Shoulder ABDUCTION   While holding a wand/cane palm face up on the injured side and palm face down on the uninjured side, slowly raise up your injured arm to the side.        5) Horizontal Abduction/Adduction      Straight arms holding cane at shoulder height, bring cane to right, center, left. Repeat starting to left.   Copyright  VHI. All rights reserved.     Repeat all exercises 10-15 times, 1-2 times per day.  1) Shoulder Protraction    Begin with elbows by your side, slowly "punch" straight out in front of you.      2) Shoulder Flexion  Supine:     Standing:         Begin with arms at your side with thumbs pointed up, slowly raise both arms up and forward towards overhead.               3) Horizontal abduction/adduction  Supine:   Standing:           Begin with arms straight out in front of you, bring out to the side in at "T" shape. Keep arms straight entire time.                 4) Internal & External Rotation   Supine:     Standing:     Stand with elbows at the side and elbows bent 90 degrees. Move your forearms away from your body,  then bring back inward toward the body.     5) Shoulder Abduction  Supine:     Standing:       Lying on your back begin with your arms flat on the table next to your side. Slowly move your arms out to the side so that they go overhead, in a jumping jack or snow angel movement.    

## 2022-10-15 NOTE — Therapy (Signed)
OUTPATIENT OCCUPATIONAL THERAPY NEURO TREATMENT NOTE  Patient Name: Randy Ashley MRN: [id number] DOB:Sep 13, 1956, 66 y.o., male Today's Date: 10/16/2022  PCP: Gareth Morgan, MD REFERRING PROVIDER: Dietrich Pates, MD   OT End of Session - 10/15/22 1115     Visit Number 2    Number of Visits 5    Date for OT Re-Evaluation 11/04/22    Authorization Type [employer], $20 copay    Authorization Time Period No visit limit    OT Start Time 1030    OT Stop Time 1115    OT Time Calculation (min) 45 min    Activity Tolerance Patient tolerated treatment well    Behavior During Therapy [facility] for tasks assessed/performed            Past Medical History:  Diagnosis Date   Chronic back pain    Hypertension    Lumbar radiculopathy    Past Surgical History:  Procedure Laterality Date   HERNIA REPAIR     Patient Active Problem List   Diagnosis Date Noted   Hypertensive emergency 09/06/2022   Acute CVA (cerebrovascular accident) (HCC) 09/06/2022   Uncontrolled hypertension 09/06/2022   Smoker 09/06/2022   Heavy alcohol consumption 09/06/2022   Elevated LFTs 09/06/2022   Thrombocytopenia (HCC) 09/06/2022   Hyperglycemia 09/06/2022   GERD (gastroesophageal reflux disease) 09/06/2022   CLOSED FRACTURE OF BASE OF OTHER METACARPAL BONE 02/10/2011    ONSET DATE: 09/06/2022  REFERRING DIAG: R CVA with L sided weakness  THERAPY DIAG:  Stiffness of left shoulder, not elsewhere classified  Other lack of coordination  Other symptoms and signs involving the nervous system  Rationale for Evaluation and Treatment: Rehabilitation  SUBJECTIVE:   SUBJECTIVE STATEMENT: "I'm tired all the time now, everything wears me out."  PRECAUTIONS: Fall  WEIGHT BEARING RESTRICTIONS: No  PAIN:  Are you having pain? No  FALLS: Has patient fallen in last 6 months? No  PATIENT GOALS: I want to get back like I was.  OBJECTIVE:   HAND DOMINANCE: Left  ADLs: Overall ADLs: Limited with  cooking and cleaning, difficulty with bathing tasks. Unable to work at this time.  MOBILITY STATUS:  Supervision with cane  POSTURE COMMENTS:  No Significant postural limitations Sitting balance: Moves/returns truncal midpoint >2 inches in all planes  ACTIVITY TOLERANCE: Activity tolerance: Limited due to strength and endurance  FUNCTIONAL OUTCOME MEASURES: FOTO: 51.17  UPPER EXTREMITY ROM:    Active ROM Left eval  Shoulder flexion 143  Shoulder abduction 136  Shoulder internal rotation 90  Shoulder external rotation 68  (Blank rows = not tested)  UPPER EXTREMITY MMT:     MMT Left eval  Shoulder flexion 4/5  Shoulder abduction 4+/5  Shoulder adduction 4/5  Shoulder extension 4/5  Shoulder internal rotation 5/5  Shoulder external rotation 5/5  Elbow flexion 5/5  Elbow extension 5/5  (Blank rows = not tested)  HAND FUNCTION: Grip strength: Right: 68 lbs; Left: 60 lbs, Lateral pinch: Right: 23 lbs, Left: 23 lbs, and 3 point pinch: Right: 18 lbs, Left: 16 lbs  COORDINATION: 9 Hole Peg test: Right: 29 sec; Left: 34 sec  SENSATION: Light touch: Impaired   EDEMA: No Edema noted  OBSERVATIONS: Coordination with small items is slightly off due to numbness.   TODAY'S TREATMENT:  DATE:  10/15/22: -AA/ROM: 3lb dowel, flexion, abduction, protraction, horizontal abduction, er/IR, 1x10 -A/ROM: seated, flexion, abduction, protraction, horizontal abduction, er/IR, 1x10 -Scapular ROM: extension, retraction, rows, 1x10 -Strengthening: 2.5lbs, bicep curls, tricep extensions, 1x10     PATIENT EDUCATION: Education details: A/ROM and AA/ROM Person educated: Patient Education method: Explanation Education comprehension: verbalized understanding  HOME EXERCISE PROGRAM: 11/8: A/ROM and AA/ROM   GOALS: Goals reviewed with patient? Yes  SHORT TERM  GOALS: Target date: 11/04/22  Pt will be educated on HEP to improve ability to actively use LUE during functional task completion. Goal status: IN PROGRESS  2.  Pt will increase strength in LUE to 5/5 to improve ability to perform lifting tasks required during cooking and cleaning. Goal status: IN PROGRESS  3.  Pt will increase fine motor coordination in LUE by completing the 9 hole peg test in under 30" to improve ability to perform dressing tasks such as operating buttons and zippers.  Goal status: IN PROGRESS  4.  Pt will increase A/ROM in LUE to [facility] to improve ability to reach overhead and behind back during dressing and bathing tasks.  Goal status: IN PROGRESS   ASSESSMENT:  CLINICAL IMPRESSION: Pt presents to therapy session with no complaints of pain, only stating that he fatigues quickly. This session focusing on ROM and minimal strengthening. Pt requiring 1-2 min breaks after each task, as well as intermittent quick rest breaks during certain tasks. Therapist providing mod verbal and visual cuing throughout session for technique and proper positioning. Pt continues to benefit from skilled OT to maximize ROM, strength, and coordination in order to improve independence with ADL's and IADL's.   PLAN:  OT FREQUENCY: 1x/week  OT DURATION: 4 weeks  PLANNED INTERVENTIONS: self care/ADL training, therapeutic exercise, therapeutic activity, neuromuscular re-education, manual therapy, passive range of motion, balance training, functional mobility training, electrical stimulation, ultrasound, paraffin, moist heat, cryotherapy, patient/family education, and coping strategies training  RECOMMENDED OTHER SERVICES: N/A  CONSULTED AND AGREED WITH PLAN OF CARE: Patient  PLAN FOR NEXT SESSION: Coordination tasks, strengthening tasks, bilateral coordination tasks   Melvine Julin Bing Plume, OTR/L [facility] [phone] Kennyth Arnold, OT 10/16/2022, 9:29 AM

## 2022-10-16 ENCOUNTER — Encounter (HOSPITAL_COMMUNITY): Payer: Self-pay | Admitting: Occupational Therapy

## 2022-10-16 ENCOUNTER — Encounter (HOSPITAL_COMMUNITY): Payer: Self-pay | Admitting: Physical Therapy

## 2022-10-16 ENCOUNTER — Ambulatory Visit (HOSPITAL_COMMUNITY): Payer: Medicare HMO | Admitting: Physical Therapy

## 2022-10-16 DIAGNOSIS — M6281 Muscle weakness (generalized): Secondary | ICD-10-CM

## 2022-10-16 DIAGNOSIS — R262 Difficulty in walking, not elsewhere classified: Secondary | ICD-10-CM

## 2022-10-16 NOTE — Therapy (Signed)
OUTPATIENT PHYSICAL THERAPY NEURO EVALUATION   Patient Name: Randy Ashley MRN: [id number] DOB:1956-05-25, 66 y.o., male Today's Date: 10/16/2022   PCP: Dr. Karie Kirks REFERRING PROVIDER: Dr. Dorris Carnes   PT End of Session - 10/16/22 0900     Visit Number 2    Number of Visits 12    Date for PT Re-Evaluation 11/21/22    Authorization Type humana    Authorization Time Period 12 approved 11/3-12/15/23    Authorization - Visit Number 1    Authorization - Number of Visits 12    Progress Note Due on Visit 10    PT Start Time 0900    PT Stop Time 0940    PT Time Calculation (min) 40 min    Activity Tolerance Patient tolerated treatment well    Behavior During Therapy [facility] for tasks assessed/performed             Past Medical History:  Diagnosis Date   Chronic back pain    Hypertension    Lumbar radiculopathy    Past Surgical History:  Procedure Laterality Date   HERNIA REPAIR     Patient Active Problem List   Diagnosis Date Noted   Hypertensive emergency 09/06/2022   Acute CVA (cerebrovascular accident) ([place]) 09/06/2022   Uncontrolled hypertension 09/06/2022   Smoker 09/06/2022   Heavy alcohol consumption 09/06/2022   Elevated LFTs 09/06/2022   Thrombocytopenia ([place]) 09/06/2022   Hyperglycemia 09/06/2022   GERD (gastroesophageal reflux disease) 09/06/2022   CLOSED FRACTURE OF BASE OF OTHER METACARPAL BONE 02/10/2011    ONSET DATE: 09/06/2022  REFERRING DIAG:  Diagnosis  I63.9 (ICD-10-CM) - Acute CVA (cerebrovascular accident) ([place])   THERAPY DIAG:  Muscle weakness  Rationale for Evaluation and Treatment: Rehabilitation  SUBJECTIVE:                                                                                                                                                                                             SUBJECTIVE STATEMENT:   Patient states he has been tired. He has not been compliant with HEP. No pain just numb throughout left side .  Pt  accompanied by: self  PERTINENT HISTORY: 66 year old gentleman with recently diagnosed hypertension, daily alcohol consumer, smoker, GERD presents to the ED complaining of left-sided numbness since yesterday morning.  He reportedly was recently diagnosed with hypertension and started on amlodipine 2.5 mg daily from an ED visit on August 05, 2022.  He says he has not been taking it regularly.  He reported that yesterday morning after a shower he started to feel the symptoms of left-sided numbness.  The symptoms have persisted prompting  his visit.  He denies chest pain.  He denies shortness of breath.  He denies vision changes.  He denies rash.  He reportedly has been smoking cigarettes since age 64.  His blood pressure was notably elevated on arrival with a SBP up to 217.  He had a CT brain with no acute findings.  Neurology was consulted and felt that patient can safely stay at [facility].       PAIN:  Are you having pain? No  PRECAUTIONS: Fall  WEIGHT BEARING RESTRICTIONS: No  FALLS: Has patient fallen in last 6 months? No  LIVING ENVIRONMENT: Lives with: lives with their spouse Lives in: House/apartment Stairs: No Has following equipment at home: Single point cane pt was not using the can prior to the stroke.   PLOF: Independent  PATIENT GOALS: TO be able to walk without his cane, to get his LT side better.   OBJECTIVE:   DIAGNOSTIC FINDINGS: IMPRESSION: MRI brain:   1. 7 mm acute infarct within the dorsal right pons. 2. 4 mm acute infarct within the right thalamus. 3. 4 mm acute/early subacute infarct within the right frontoparietal periventricular white matter. 4. 6 mm acute/early subacute infarct within the posterior right frontal lobe white matter. 5. 4 mm acute/early subacute infarct within the anterior right subinsular white matter 6. Given that the above described infarcts involve multiple vascular territories, consider an embolic process. 7. 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   MRA head:   1. No intracranial large vessel occlusion is identified. 2. Intracranial atherosclerotic disease. Most notably, there are apparent moderate/severe stenoses within the bilateral posterior cerebral arteries at the P2/P3 junctions.     Electronically Signed   By: Kellie Simmering D.O.   On: 09/08/2022 14:09  COGNITION: Overall cognitive status: Within functional limits for tasks assessed   SENSATION: Not tested   POSTURE: rounded shoulders, forward head, decreased lumbar lordosis, and increased thoracic kyphosis    LOWER EXTREMITY MMT:    MMT Right Eval Left Eval  Hip flexion 5/5 3-/5  Hip extension 5/5 4/5  Hip abduction 5/5 3+/5  Hip adduction    Hip internal rotation    Hip external rotation    Knee flexion 5/5 5/5  Knee extension 5/5 4/5  Ankle dorsiflexion 5/5 4/5  Ankle plantarflexion    Ankle inversion    Ankle eversion    (Blank rows = not tested)  BED MOBILITY:  I   FUNCTIONAL TESTS:  30 seconds chair stand test:  7 2 minute walk test: 255' Single leg stance:  RT:  0"  , Lt:  7"  PATIENT SURVEYS:  FOTO 46  TODAY'S TREATMENT:                                                                                                                              DATE:  10/16/22 SLR 2 x 10 Bridge 2 x 10  Sidelying hip abduction 2 x 10  Heel raise 2 x 10 Toe raise 2 x 10 Standing hip abduction 2 x10 Standing hip extension 2 x10 Sit to stand x 10 Step up 4 inch 2 x 10 HHA x 1   10/10/2022 Evaluation: sit to stand 10                    Slr x 10                    Bridge x 10                    Lt hip abduction x 10                         PATIENT EDUCATION: Education details: HEP Person educated: Patient Education method: Theatre stage manager Education comprehension: returned demonstration  HOME EXERCISE PROGRAM: Access Code: [email or web address] URL:  [email or web address] Date: 10/10/2022 Prepared by: Rayetta Humphrey  Exercises - Sit to Stand with Armchair  - 3 x daily - 7 x weekly - 1 sets - 10 reps - 3" hold - Active Straight Leg Raise with Quad Set  - 3 x daily - 7 x weekly - 1 sets - 10 reps - 3" hold - Supine Bridge  - 3 x daily - 7 x weekly - 1 sets - 10 reps - 3" hold             - Sidelying Hip Abduction  - 3 x daily - 7 x weekly - 1 sets - 10 reps - 3" hold   GOALS: Goals reviewed with patient? No  SHORT TERM GOALS: Target date: 10/30/2022  Pt to be I in HEP to improve strength by 1/2 grade to be able to go up and down 4 steps with a hand rail  Baseline: Goal status: INITIAL  2.  Pt to be able to single leg stance B x 10 seconds to be able to walk indoor without assistive device to reduce risk of falling.  Baseline:  Goal status: INITIAL    LONG TERM GOALS: Target date: 11/13/2022  PT to be I in an advanced HEP in order to increase mm strength one grade to be abel to go up and down 4 steps in a reciprocal manner.   Baseline:  Goal status: INITIAL  2.  Pt to improve balance to feel confident walking outside without an assistive device  Baseline:  Goal status: INITIAL    ASSESSMENT:  CLINICAL IMPRESSION: Patient tolerated session well today. Reviewed goals and HEP. Patient with fairly good return despite subjective report of not completing HEP. Progressed LE strengthening with added standing activity. Patient performs exercise in slow, controlled manner. Does note mild fatigue throughout session and requires intermittent breaks for rest. Encouraged regular compliance with HEP. Patient will continue to benefit from skilled therapy services to reduce remaining deficits and improve functional ability.    OBJECTIVE IMPAIRMENTS: cardiopulmonary status limiting activity, decreased activity tolerance, decreased balance, difficulty walking, and decreased strength.   ACTIVITY LIMITATIONS: carrying, lifting,  standing, squatting, and locomotion level  PARTICIPATION LIMITATIONS: cleaning, shopping, and community activity  PERSONAL FACTORS: 2 comorbidity: HTN, smoking are also affecting patient's functional outcome.   REHAB POTENTIAL: Good  CLINICAL DECISION MAKING: Stable/uncomplicated  EVALUATION COMPLEXITY: Low  PLAN:  PT FREQUENCY: 2x/week  PT DURATION: 6 weeks  PLANNED INTERVENTIONS: Therapeutic exercises, Therapeutic activity, Neuromuscular re-education, Balance training, Gait training, Patient/Family  education, and Self Care  PLAN FOR NEXT SESSION: Continue with balance and wt bearing strengthening exercises.   9:01 AM, 10/16/22 Georges Lynch PT DPT  Physical Therapist with [facility]  [phone]

## 2022-10-21 ENCOUNTER — Encounter (HOSPITAL_COMMUNITY): Payer: Self-pay

## 2022-10-21 ENCOUNTER — Ambulatory Visit (HOSPITAL_COMMUNITY): Payer: Medicare HMO

## 2022-10-21 DIAGNOSIS — R262 Difficulty in walking, not elsewhere classified: Secondary | ICD-10-CM

## 2022-10-21 DIAGNOSIS — M6281 Muscle weakness (generalized): Secondary | ICD-10-CM

## 2022-10-21 NOTE — Therapy (Signed)
OUTPATIENT PHYSICAL THERAPY NEURO TREATMENT   Patient Name: Randy Ashley MRN: [id number] DOB:Jan 24, 1956, 66 y.o., male Today's Date: 10/21/2022   PCP: Dr. Sudie Bailey REFERRING PROVIDER: Dr. Dietrich Pates   PT End of Session - 10/21/22 0821     Visit Number 3    Number of Visits 12    Date for PT Re-Evaluation 11/21/22    Authorization Type humana    Authorization Time Period 12 approved 11/3-12/15/23    Authorization - Visit Number 3    Authorization - Number of Visits 12    Progress Note Due on Visit 10    PT Start Time 0820    PT Stop Time 0859    PT Time Calculation (min) 39 min    Activity Tolerance Patient tolerated treatment well    Behavior During Therapy [facility] for tasks assessed/performed             Past Medical History:  Diagnosis Date   Chronic back pain    Hypertension    Lumbar radiculopathy    Past Surgical History:  Procedure Laterality Date   HERNIA REPAIR     Patient Active Problem List   Diagnosis Date Noted   Hypertensive emergency 09/06/2022   Acute CVA (cerebrovascular accident) (HCC) 09/06/2022   Uncontrolled hypertension 09/06/2022   Smoker 09/06/2022   Heavy alcohol consumption 09/06/2022   Elevated LFTs 09/06/2022   Thrombocytopenia (HCC) 09/06/2022   Hyperglycemia 09/06/2022   GERD (gastroesophageal reflux disease) 09/06/2022   CLOSED FRACTURE OF BASE OF OTHER METACARPAL BONE 02/10/2011    ONSET DATE: 09/06/2022  REFERRING DIAG:  Diagnosis  I63.9 (ICD-10-CM) - Acute CVA (cerebrovascular accident) (HCC)   THERAPY DIAG:  Muscle weakness  Rationale for Evaluation and Treatment: Rehabilitation  SUBJECTIVE:                                                                                                                                                                                             SUBJECTIVE STATEMENT:   Pt stated his numbness is getting worse, no reports of pain or recent falls.  Admits to not completing HEP at home,  doesn't feel like it.  Pt stated his Lt leg is stiff today.  Pt accompanied by: self  PERTINENT HISTORY: 66 year old gentleman with recently diagnosed hypertension, daily alcohol consumer, smoker, GERD presents to the ED complaining of left-sided numbness since yesterday morning.  He reportedly was recently diagnosed with hypertension and started on amlodipine 2.5 mg daily from an ED visit on August 05, 2022.  He says he has not been taking it regularly.  He reported that yesterday morning after a shower he  started to feel the symptoms of left-sided numbness.  The symptoms have persisted prompting his visit.  He denies chest pain.  He denies shortness of breath.  He denies vision changes.  He denies rash.  He reportedly has been smoking cigarettes since age 45.  His blood pressure was notably elevated on arrival with a SBP up to 217.  He had a CT brain with no acute findings.  Neurology was consulted and felt that patient can safely stay at [facility].       PAIN:  Are you having pain? No  PRECAUTIONS: Fall  WEIGHT BEARING RESTRICTIONS: No  FALLS: Has patient fallen in last 6 months? No  LIVING ENVIRONMENT: Lives with: lives with their spouse Lives in: House/apartment Stairs: No Has following equipment at home: Single point cane pt was not using the can prior to the stroke.   PLOF: Independent  PATIENT GOALS: TO be able to walk without his cane, to get his LT side better.   OBJECTIVE:   DIAGNOSTIC FINDINGS: IMPRESSION: MRI brain:   1. 7 mm acute infarct within the dorsal right pons. 2. 4 mm acute infarct within the right thalamus. 3. 4 mm acute/early subacute infarct within the right frontoparietal periventricular white matter. 4. 6 mm acute/early subacute infarct within the posterior right frontal lobe white matter. 5. 4 mm acute/early subacute infarct within the anterior right subinsular white matter 6. Given that the above described infarcts involve multiple  vascular territories, consider an embolic process. 7. Background chronic small vessel ischemic disease with multiple chronic small-vessel infarcts, as described.   MRA head:   1. No intracranial large vessel occlusion is identified. 2. Intracranial atherosclerotic disease. Most notably, there are apparent moderate/severe stenoses within the bilateral posterior cerebral arteries at the P2/P3 junctions.     Electronically Signed   By: Jackey Loge D.O.   On: 09/08/2022 14:09  COGNITION: Overall cognitive status: Within functional limits for tasks assessed   SENSATION: Not tested   POSTURE: rounded shoulders, forward head, decreased lumbar lordosis, and increased thoracic kyphosis    LOWER EXTREMITY MMT:    MMT Right Eval Left Eval  Hip flexion 5/5 3-/5  Hip extension 5/5 4/5  Hip abduction 5/5 3+/5  Hip adduction    Hip internal rotation    Hip external rotation    Knee flexion 5/5 5/5  Knee extension 5/5 4/5  Ankle dorsiflexion 5/5 4/5  Ankle plantarflexion    Ankle inversion    Ankle eversion    (Blank rows = not tested)  BED MOBILITY:  I   FUNCTIONAL TESTS:  30 seconds chair stand test:  7 2 minute walk test: 255' Single leg stance:  RT:  0"  , Lt:  7"  PATIENT SURVEYS:  FOTO 46  TODAY'S TREATMENT:  DATE:  10/21/22: Rockerboard lateral x   DF/PF x 1 min with 1 HHA Heel raises 15x Toe raises 15 Controlled STS no HHA 10x Marching 10x 3" alternating with minimal HHA 3D hip excursion (Weight shifting, rotation, STS)  10/16/22 SLR 2 x 10 Bridge 2 x 10 Sidelying hip abduction 2 x 10  Heel raise 2 x 10 Toe raise 2 x 10 Standing hip abduction 2 x10 Standing hip extension 2 x10 Sit to stand x 10 Step up 4 inch 2 x 10 HHA x 1   10/10/2022 Evaluation: sit to stand 10                    Slr x 10                     Bridge x 10                    Lt hip abduction x 10                         PATIENT EDUCATION: Education details: HEP Person educated: Patient Education method: Chief Technology Officer Education comprehension: returned demonstration  HOME EXERCISE PROGRAM: Access Code: [email or web address] URL: [email or web address] Date: 10/10/2022 Prepared by: Virgina Organ  Exercises - Sit to Stand with Armchair  - 3 x daily - 7 x weekly - 1 sets - 10 reps - 3" hold - Active Straight Leg Raise with Quad Set  - 3 x daily - 7 x weekly - 1 sets - 10 reps - 3" hold - Supine Bridge  - 3 x daily - 7 x weekly - 1 sets - 10 reps - 3" hold             - Sidelying Hip Abduction  - 3 x daily - 7 x weekly - 1 sets - 10 reps - 3" hold   GOALS: Goals reviewed with patient? No  SHORT TERM GOALS: Target date: 10/30/2022  Pt to be I in HEP to improve strength by 1/2 grade to be able to go up and down 4 steps with a hand rail  Baseline: Goal status: IN PROGRESS  2.  Pt to be able to single leg stance B x 10 seconds to be able to walk indoor without assistive device to reduce risk of falling.  Baseline:  Goal status: IN PROGRESS    LONG TERM GOALS: Target date: 11/13/2022  PT to be I in an advanced HEP in order to increase mm strength one grade to be abel to go up and down 4 steps in a reciprocal manner.   Baseline:  Goal status: IN PROGRESS  2.  Pt to improve balance to feel confident walking outside without an assistive device  Baseline:  Goal status: IN PROGRESS    ASSESSMENT:  CLINICAL IMPRESSION: Pt educated importance of compliance with HEP for maximal benefits, pt stated he is unmotivated to complete at home, discussed strategies to improve compliance.  Session focus with weight bearing activities for LE strengthening and balance with SBA and intermittent HHA.  Pt did require short duration seated rest breaks through session.  EOS pt reports increase fatigue.     OBJECTIVE  IMPAIRMENTS: cardiopulmonary status limiting activity, decreased activity tolerance, decreased balance, difficulty walking, and decreased strength.   ACTIVITY LIMITATIONS: carrying, lifting, standing, squatting, and locomotion level  PARTICIPATION LIMITATIONS: cleaning, shopping, and community activity  PERSONAL FACTORS: 2 comorbidity: HTN, smoking  are also affecting patient's functional outcome.   REHAB POTENTIAL: Good  CLINICAL DECISION MAKING: Stable/uncomplicated  EVALUATION COMPLEXITY: Low  PLAN:  PT FREQUENCY: 2x/week  PT DURATION: 6 weeks  PLANNED INTERVENTIONS: Therapeutic exercises, Therapeutic activity, Neuromuscular re-education, Balance training, Gait training, Patient/Family education, and Self Care  PLAN FOR NEXT SESSION: Continue with balance and wt bearing strengthening exercises.  Add squats and sidestep next session.  Becky Saxasey Nikaya Nasby, LPTA/CLT; CBIS [phone]  10:43 AM, 10/21/22

## 2022-10-24 ENCOUNTER — Ambulatory Visit (HOSPITAL_COMMUNITY): Payer: Medicare HMO

## 2022-10-24 ENCOUNTER — Encounter (HOSPITAL_COMMUNITY): Payer: Self-pay

## 2022-10-24 ENCOUNTER — Encounter (HOSPITAL_COMMUNITY): Payer: Self-pay | Admitting: Occupational Therapy

## 2022-10-24 ENCOUNTER — Ambulatory Visit (HOSPITAL_COMMUNITY): Payer: Medicare HMO | Admitting: Occupational Therapy

## 2022-10-24 DIAGNOSIS — M6281 Muscle weakness (generalized): Secondary | ICD-10-CM

## 2022-10-24 DIAGNOSIS — R278 Other lack of coordination: Secondary | ICD-10-CM

## 2022-10-24 DIAGNOSIS — M25612 Stiffness of left shoulder, not elsewhere classified: Secondary | ICD-10-CM

## 2022-10-24 DIAGNOSIS — R29818 Other symptoms and signs involving the nervous system: Secondary | ICD-10-CM

## 2022-10-24 DIAGNOSIS — R262 Difficulty in walking, not elsewhere classified: Secondary | ICD-10-CM

## 2022-10-24 NOTE — Therapy (Signed)
OUTPATIENT PHYSICAL THERAPY NEURO TREATMENT   Patient Name: Randy Ashley MRN: [id number] DOB:1955/12/28, 66 y.o., male Today's Date: 10/24/2022   PCP: Dr. Sudie BaileyKnowlton REFERRING PROVIDER: Dr. Dietrich PatesPaula Ross   PT End of Session - 10/24/22 1037     Visit Number 4    Number of Visits 12    Date for PT Re-Evaluation 11/21/22    Authorization Type humana    Authorization Time Period 12 approved 11/3-12/15/23    Authorization - Visit Number 4    Authorization - Number of Visits 12    Progress Note Due on Visit 10    PT Start Time 1035    PT Stop Time 1113    PT Time Calculation (min) 38 min    Equipment Utilized During Treatment --   SBA   Activity Tolerance Patient tolerated treatment well    Behavior During Therapy WFL for tasks assessed/performed             Past Medical History:  Diagnosis Date   Chronic back pain    Hypertension    Lumbar radiculopathy    Past Surgical History:  Procedure Laterality Date   HERNIA REPAIR     Patient Active Problem List   Diagnosis Date Noted   Hypertensive emergency 09/06/2022   Acute CVA (cerebrovascular accident) (HCC) 09/06/2022   Uncontrolled hypertension 09/06/2022   Smoker 09/06/2022   Heavy alcohol consumption 09/06/2022   Elevated LFTs 09/06/2022   Thrombocytopenia (HCC) 09/06/2022   Hyperglycemia 09/06/2022   GERD (gastroesophageal reflux disease) 09/06/2022   CLOSED FRACTURE OF BASE OF OTHER METACARPAL BONE 02/10/2011    ONSET DATE: 09/06/2022  REFERRING DIAG:  Diagnosis  I63.9 (ICD-10-CM) - Acute CVA (cerebrovascular accident) (HCC)   THERAPY DIAG:  Muscle weakness  Rationale for Evaluation and Treatment: Rehabilitation  SUBJECTIVE:                                                                                                                                                                                             SUBJECTIVE STATEMENT:   Pt stated he continues to have numbness in Lt side since CVA.   Admits his hasn't completed any exercises at home.  Stated he is tired at entrance, following OT session today.    Pt accompanied by: self  PERTINENT HISTORY: 66 year old gentleman with recently diagnosed hypertension, daily alcohol consumer, smoker, GERD presents to the ED complaining of left-sided numbness since yesterday morning.  He reportedly was recently diagnosed with hypertension and started on amlodipine 2.5 mg daily from an ED visit on August 05, 2022.  He says he has not been taking it regularly.  He reported that yesterday morning after a shower he started to feel the symptoms of left-sided numbness.  The symptoms have persisted prompting his visit.  He denies chest pain.  He denies shortness of breath.  He denies vision changes.  He denies rash.  He reportedly has been smoking cigarettes since age 44.  His blood pressure was notably elevated on arrival with a SBP up to 217.  He had a CT brain with no acute findings.  Neurology was consulted and felt that patient can safely stay at [facility].       PAIN:  Are you having pain? No  PRECAUTIONS: Fall  WEIGHT BEARING RESTRICTIONS: No  FALLS: Has patient fallen in last 6 months? No  LIVING ENVIRONMENT: Lives with: lives with their spouse Lives in: House/apartment Stairs: No Has following equipment at home: Single point cane pt was not using the can prior to the stroke.   PLOF: Independent  PATIENT GOALS: TO be able to walk without his cane, to get his LT side better.   OBJECTIVE:   DIAGNOSTIC FINDINGS: IMPRESSION: MRI brain:   1. 7 mm acute infarct within the dorsal right pons. 2. 4 mm acute infarct within the right thalamus. 3. 4 mm acute/early subacute infarct within the right frontoparietal periventricular white matter. 4. 6 mm acute/early subacute infarct within the posterior right frontal lobe white matter. 5. 4 mm acute/early subacute infarct within the anterior right subinsular white matter 6. Given that the above  described infarcts involve multiple vascular territories, consider an embolic process. 7. Background chronic small vessel ischemic disease with multiple chronic small-vessel infarcts, as described.   MRA head:   1. No intracranial large vessel occlusion is identified. 2. Intracranial atherosclerotic disease. Most notably, there are apparent moderate/severe stenoses within the bilateral posterior cerebral arteries at the P2/P3 junctions.     Electronically Signed   By: Jackey Loge D.O.   On: 09/08/2022 14:09  COGNITION: Overall cognitive status: Within functional limits for tasks assessed   SENSATION: Not tested   POSTURE: rounded shoulders, forward head, decreased lumbar lordosis, and increased thoracic kyphosis    LOWER EXTREMITY MMT:    MMT Right Eval Left Eval  Hip flexion 5/5 3-/5  Hip extension 5/5 4/5  Hip abduction 5/5 3+/5  Hip adduction    Hip internal rotation    Hip external rotation    Knee flexion 5/5 5/5  Knee extension 5/5 4/5  Ankle dorsiflexion 5/5 4/5  Ankle plantarflexion    Ankle inversion    Ankle eversion    (Blank rows = not tested)  BED MOBILITY:  I   FUNCTIONAL TESTS:  30 seconds chair stand test:  7 2 minute walk test: 255' Single leg stance:  RT:  0"  , Lt:  7"  PATIENT SURVEYS:  FOTO 46  TODAY'S TREATMENT:  DATE:  10/24/22 Squat front of chair for mechanics 2x 10 Heel raises 15 Toe raises 15x Sidestep with RTB around thighs 2RT Toe tapping with opposite UE/LE 6in step height SLS Lt 6", Rt 12" max of 3 3D hip excursion (Weight shifting, rotation, STS) Tandem stance 2x 30" intermittent HHA  Quadruped Opposite UE/LE 3x 5" cueing for coordination  10/21/22: Rockerboard lateral x   DF/PF x 1 min with 1 HHA Heel raises 15x Toe raises 15 Controlled STS no HHA 10x Marching 10x 3"  alternating with minimal HHA 3D hip excursion (Weight shifting, rotation, STS)  10/16/22 SLR 2 x 10 Bridge 2 x 10 Sidelying hip abduction 2 x 10  Heel raise 2 x 10 Toe raise 2 x 10 Standing hip abduction 2 x10 Standing hip extension 2 x10 Sit to stand x 10 Step up 4 inch 2 x 10 HHA x 1   10/10/2022 Evaluation: sit to stand 10                    Slr x 10                    Bridge x 10                    Lt hip abduction x 10                         PATIENT EDUCATION: Education details: HEP Person educated: Patient Education method: Chief Technology Officer Education comprehension: returned demonstration  HOME EXERCISE PROGRAM: Access Code: [email or web address] URL: [email or web address] Date: 10/10/2022 Prepared by: Virgina Organ  10/24/22: squat, heel raise and SLS  Exercises - Sit to Stand with Armchair  - 3 x daily - 7 x weekly - 1 sets - 10 reps - 3" hold - Active Straight Leg Raise with Quad Set  - 3 x daily - 7 x weekly - 1 sets - 10 reps - 3" hold - Supine Bridge  - 3 x daily - 7 x weekly - 1 sets - 10 reps - 3" hold             - Sidelying Hip Abduction  - 3 x daily - 7 x weekly - 1 sets - 10 reps - 3" hold   GOALS: Goals reviewed with patient? No  SHORT TERM GOALS: Target date: 10/30/2022  Pt to be I in HEP to improve strength by 1/2 grade to be able to go up and down 4 steps with a hand rail  Baseline: Goal status: IN PROGRESS  2.  Pt to be able to single leg stance B x 10 seconds to be able to walk indoor without assistive device to reduce risk of falling.  Baseline:  Goal status: IN PROGRESS    LONG TERM GOALS: Target date: 11/13/2022  PT to be I in an advanced HEP in order to increase mm strength one grade to be abel to go up and down 4 steps in a reciprocal manner.   Baseline:  Goal status: IN PROGRESS  2.  Pt to improve balance to feel confident walking outside without an assistive device  Baseline:  Goal status: IN  PROGRESS    ASSESSMENT:  CLINICAL IMPRESSION: Session focus with functional strengthening, balance and motor control activities to improve coordination.  Pt presents with difficulty with coordination opposite UE/LE, educated purpose of exercise to improve sequence with gait.  Min A with new quadruped  exercises and cueing for mechanics, SBA during advanced balance activities for safety.  No reports of pain, was limited by fatigue through session required short duration seated rest breaks through session.  Pt admits to noncompliance with current HEP, stated he sits at home and not motivated.  Discussed possible gym and/or friends to complete exercises at home to improve motivation for maximal benefits.    OBJECTIVE IMPAIRMENTS: cardiopulmonary status limiting activity, decreased activity tolerance, decreased balance, difficulty walking, and decreased strength.   ACTIVITY LIMITATIONS: carrying, lifting, standing, squatting, and locomotion level  PARTICIPATION LIMITATIONS: cleaning, shopping, and community activity  PERSONAL FACTORS: 2 comorbidity: HTN, smoking are also affecting patient's functional outcome.   REHAB POTENTIAL: Good  CLINICAL DECISION MAKING: Stable/uncomplicated  EVALUATION COMPLEXITY: Low  PLAN:  PT FREQUENCY: 2x/week  PT DURATION: 6 weeks  PLANNED INTERVENTIONS: Therapeutic exercises, Therapeutic activity, Neuromuscular re-education, Balance training, Gait training, Patient/Family education, and Self Care  PLAN FOR NEXT SESSION: Continue with balance and wt bearing strengthening exercises.    Becky Sax, LPTA/CLT; CBIS [phone]  12:17 PM, 10/24/22

## 2022-10-24 NOTE — Patient Instructions (Signed)
1) (Home) Extension: Isometric / Bilateral Arm Retraction - Sitting   Facing anchor, hold hands and elbow at shoulder height, with elbow bent.  Pull arms back to squeeze shoulder blades together. Repeat 10-15 times. 1-3 times/day.   2) (Clinic) Extension / Flexion (Assist)   Face anchor, pull arms back, keeping elbow straight, and squeze shoulder blades together. Repeat 10-15 times. 1-3 times/day.   Copyright  VHI. All rights reserved.   3) (Home) Retraction: Row - Bilateral (Anchor)   Facing anchor, arms reaching forward, pull hands toward stomach, keeping elbows bent and at your sides and pinching shoulder blades together. Repeat 10-15 times. 1-3 times/day.   Copyright  VHI. All rights reserved.    Home Exercises Program Theraputty Exercises  Do the following exercises 2-3 times a day using your affected hand.  1. Roll putty into a ball.  2. Make into a pancake.  3. Roll putty into a roll.  4. Pinch along log with first finger and thumb.   5. Make into a ball.  6. Roll it back into a log.   7. Pinch using thumb and side of first finger.  8. Roll into a ball, then flatten into a pancake.  9. Using your fingers, make putty into a mountain.  10. Roll putty back into a ball and squeeze gently for 2-3 minutes.

## 2022-10-24 NOTE — Therapy (Signed)
OUTPATIENT OCCUPATIONAL THERAPY NEURO TREATMENT NOTE  Patient Name: Randy Ashley MRN: [id number] DOB:06/27/1956, 66 y.o., male Today's Date: 10/24/2022  PCP: Gareth Morgan, MD REFERRING PROVIDER: Dietrich Pates, MD    Past Medical History:  Diagnosis Date   Chronic back pain    Hypertension    Lumbar radiculopathy    Past Surgical History:  Procedure Laterality Date   HERNIA REPAIR     Patient Active Problem List   Diagnosis Date Noted   Hypertensive emergency 09/06/2022   Acute CVA (cerebrovascular accident) (HCC) 09/06/2022   Uncontrolled hypertension 09/06/2022   Smoker 09/06/2022   Heavy alcohol consumption 09/06/2022   Elevated LFTs 09/06/2022   Thrombocytopenia (HCC) 09/06/2022   Hyperglycemia 09/06/2022   GERD (gastroesophageal reflux disease) 09/06/2022   CLOSED FRACTURE OF BASE OF OTHER METACARPAL BONE 02/10/2011    ONSET DATE: 09/06/2022  REFERRING DIAG: R CVA with L sided weakness  THERAPY DIAG:  No diagnosis found.  Rationale for Evaluation and Treatment: Rehabilitation  SUBJECTIVE:   SUBJECTIVE STATEMENT: "I'm tired all the time now, everything wears me out."  PRECAUTIONS: Fall  WEIGHT BEARING RESTRICTIONS: No  PAIN:  Are you having pain? No  FALLS: Has patient fallen in last 6 months? No  PATIENT GOALS: I want to get back like I was.  OBJECTIVE:   HAND DOMINANCE: Left  ADLs: Overall ADLs: Limited with cooking and cleaning, difficulty with bathing tasks. Unable to work at this time.  MOBILITY STATUS:  Supervision with cane  POSTURE COMMENTS:  No Significant postural limitations Sitting balance: Moves/returns truncal midpoint >2 inches in all planes  ACTIVITY TOLERANCE: Activity tolerance: Limited due to strength and endurance  FUNCTIONAL OUTCOME MEASURES: FOTO: 51.17  UPPER EXTREMITY ROM:    Active ROM Left eval  Shoulder flexion 143  Shoulder abduction 136  Shoulder internal rotation 90  Shoulder external rotation  68  (Blank rows = not tested)  UPPER EXTREMITY MMT:     MMT Left eval  Shoulder flexion 4/5  Shoulder abduction 4+/5  Shoulder adduction 4/5  Shoulder extension 4/5  Shoulder internal rotation 5/5  Shoulder external rotation 5/5  Elbow flexion 5/5  Elbow extension 5/5  (Blank rows = not tested)  HAND FUNCTION: Grip strength: Right: 68 lbs; Left: 60 lbs, Lateral pinch: Right: 23 lbs, Left: 23 lbs, and 3 point pinch: Right: 18 lbs, Left: 16 lbs  COORDINATION: 9 Hole Peg test: Right: 29 sec; Left: 34 sec  SENSATION: Light touch: Impaired   EDEMA: No Edema noted  OBSERVATIONS: Coordination with small items is slightly off due to numbness.   TODAY'S TREATMENT:                                                                                                                              DATE:  10/24/22: -A/ROM: seated, 2lb wrist weights, flexion, abduction, protraction, horizontal abduction, er/IR, 1x10 -Scapular Strengthening: Red Theraband, extension, retraction, protraction, rows, 1x15 -Strengthening: 5lbs, bicep curls, tricep extensions,  wrist flexion/extension, radial/ulnar deviation, supination/pronation, 1x10 -weighted ball: 4lbs, chest presses, figure 8, truncal rotations, 1x10 -  10/15/22: -AA/ROM: 3lb dowel, flexion, abduction, protraction, horizontal abduction, er/IR, 1x10 -A/ROM: seated, flexion, abduction, protraction, horizontal abduction, er/IR, 1x10 -Scapular ROM: extension, retraction, rows, 1x10 -Strengthening: 2.5lbs, bicep curls, tricep extensions, 1x10     PATIENT EDUCATION: Education details: Hospital doctor  Person educated: Patient Education method: Explanation Education comprehension: verbalized understanding  HOME EXERCISE PROGRAM: 11/8: A/ROM and AA/ROM 11/17: Scapula Strengthening   GOALS: Goals reviewed with patient? Yes  SHORT TERM GOALS: Target date: 11/04/22  Pt will be educated on HEP to improve ability to actively use LUE  during functional task completion. Goal status: IN PROGRESS  2.  Pt will increase strength in LUE to 5/5 to improve ability to perform lifting tasks required during cooking and cleaning. Goal status: IN PROGRESS  3.  Pt will increase fine motor coordination in LUE by completing the 9 hole peg test in under 30" to improve ability to perform dressing tasks such as operating buttons and zippers.  Goal status: IN PROGRESS  4.  Pt will increase A/ROM in LUE to [facility] to improve ability to reach overhead and behind back during dressing and bathing tasks.  Goal status: IN PROGRESS   ASSESSMENT:  CLINICAL IMPRESSION: Pt presents to therapy session with no complaints of pain, only stating that he fatigues quickly. This session focusing on ROM and minimal strengthening. Pt requiring 1-2 min breaks after each task, as well as intermittent quick rest breaks during certain tasks. Therapist providing mod verbal and visual cuing throughout session for technique and proper positioning. Pt continues to benefit from skilled OT to maximize ROM, strength, and coordination in order to improve independence with ADL's and IADL's.   PLAN:  OT FREQUENCY: 1x/week  OT DURATION: 4 weeks  PLANNED INTERVENTIONS: self care/ADL training, therapeutic exercise, therapeutic activity, neuromuscular re-education, manual therapy, passive range of motion, balance training, functional mobility training, electrical stimulation, ultrasound, paraffin, moist heat, cryotherapy, patient/family education, and coping strategies training  RECOMMENDED OTHER SERVICES: N/A  CONSULTED AND AGREED WITH PLAN OF CARE: Patient  PLAN FOR NEXT SESSION: Coordination tasks, strengthening tasks, bilateral coordination tasks   Marianne Golightly Bing Plume, OTR/L [facility] [phone] Kennyth Arnold, OT 10/24/2022, 9:46 AM

## 2022-10-27 ENCOUNTER — Encounter (HOSPITAL_COMMUNITY): Payer: Self-pay | Admitting: Occupational Therapy

## 2022-10-27 ENCOUNTER — Ambulatory Visit: Payer: Medicare HMO | Admitting: Neurology

## 2022-10-28 ENCOUNTER — Encounter (HOSPITAL_COMMUNITY): Payer: Self-pay | Admitting: Occupational Therapy

## 2022-10-28 ENCOUNTER — Ambulatory Visit (HOSPITAL_COMMUNITY): Payer: Medicare HMO | Admitting: Occupational Therapy

## 2022-10-28 ENCOUNTER — Ambulatory Visit (HOSPITAL_COMMUNITY): Payer: Medicare HMO | Admitting: Physical Therapy

## 2022-10-28 DIAGNOSIS — M6281 Muscle weakness (generalized): Secondary | ICD-10-CM

## 2022-10-28 DIAGNOSIS — R29818 Other symptoms and signs involving the nervous system: Secondary | ICD-10-CM

## 2022-10-28 DIAGNOSIS — M25612 Stiffness of left shoulder, not elsewhere classified: Secondary | ICD-10-CM

## 2022-10-28 DIAGNOSIS — R262 Difficulty in walking, not elsewhere classified: Secondary | ICD-10-CM | POA: Diagnosis not present

## 2022-10-28 DIAGNOSIS — R278 Other lack of coordination: Secondary | ICD-10-CM

## 2022-10-28 NOTE — Patient Instructions (Signed)
1) Strengthening: Chest Pull - Resisted   Hold Theraband in front of body with hands about shoulder width a part. Pull band a part and back together slowly. Repeat ____ times. Complete ____ set(s) per session.. Repeat ____ session(s) per day.  http://orth.exer.us/926   Copyright  VHI. All rights reserved.   2) PNF Strengthening: Resisted   Standing with resistive band around each hand, bring right arm up and away, thumb back. Repeat ____ times per set. Do ____ sets per session. Do ____ sessions per day.                           3) Resisted External Rotation: in Neutral - Bilateral   Sit or stand, tubing in both hands, elbows at sides, bent to 90, forearms forward. Pinch shoulder blades together and rotate forearms out. Keep elbows at sides. Repeat ____ times per set. Do ____ sets per session. Do ____ sessions per day.  http://orth.exer.us/966   Copyright  VHI. All rights reserved.   4) PNF Strengthening: Resisted   Standing, hold resistive band above head. Bring right arm down and out from side. Repeat ____ times per set. Do ____ sets per session. Do ____ sessions per day.  http://orth.exer.us/922   Copyright  VHI. All rights reserved.   

## 2022-10-28 NOTE — Therapy (Signed)
OUTPATIENT OCCUPATIONAL THERAPY NEURO TREATMENT NOTE  Patient Name: Randy Ashley MRN: [id number] DOB:1956/04/05, 66 y.o., male Today's Date: 10/28/2022  PCP: Lemmie Evens, MD REFERRING PROVIDER: Dorris Carnes, MD   OT End of Session - 10/28/22 0944     Visit Number 4    Number of Visits 5    Date for OT Re-Evaluation 11/04/22    Authorization Type [employer], $20 copay    Authorization Time Period No visit limit    OT Start Time 0900    OT Stop Time 0945    OT Time Calculation (min) 45 min    Activity Tolerance Patient tolerated treatment well    Behavior During Therapy [facility] for tasks assessed/performed            Past Medical History:  Diagnosis Date   Chronic back pain    Hypertension    Lumbar radiculopathy    Past Surgical History:  Procedure Laterality Date   HERNIA REPAIR     Patient Active Problem List   Diagnosis Date Noted   Hypertensive emergency 09/06/2022   Acute CVA (cerebrovascular accident) ([place]) 09/06/2022   Uncontrolled hypertension 09/06/2022   Smoker 09/06/2022   Heavy alcohol consumption 09/06/2022   Elevated LFTs 09/06/2022   Thrombocytopenia ([place]) 09/06/2022   Hyperglycemia 09/06/2022   GERD (gastroesophageal reflux disease) 09/06/2022   CLOSED FRACTURE OF BASE OF OTHER METACARPAL BONE 02/10/2011    ONSET DATE: 09/06/2022  REFERRING DIAG: R CVA with L sided weakness  THERAPY DIAG:  Other lack of coordination  Stiffness of left shoulder, not elsewhere classified  Other symptoms and signs involving the nervous system  Rationale for Evaluation and Treatment: Rehabilitation  SUBJECTIVE:   SUBJECTIVE STATEMENT: "My arms are so stiff when I get up in the mornings"  PRECAUTIONS: Fall  WEIGHT BEARING RESTRICTIONS: No  PAIN:  Are you having pain? No  FALLS: Has patient fallen in last 6 months? No  PATIENT GOALS: I want to get back like I was.  OBJECTIVE:   HAND DOMINANCE: Left  ADLs: Overall ADLs: Limited with  cooking and cleaning, difficulty with bathing tasks. Unable to work at this time.  MOBILITY STATUS:  Supervision with cane  POSTURE COMMENTS:  No Significant postural limitations Sitting balance: Moves/returns truncal midpoint >2 inches in all planes  ACTIVITY TOLERANCE: Activity tolerance: Limited due to strength and endurance  FUNCTIONAL OUTCOME MEASURES: FOTO: 51.17  UPPER EXTREMITY ROM:    Active ROM Left eval  Shoulder flexion 143  Shoulder abduction 136  Shoulder internal rotation 90  Shoulder external rotation 68  (Blank rows = not tested)  UPPER EXTREMITY MMT:     MMT Left eval  Shoulder flexion 4/5  Shoulder abduction 4+/5  Shoulder adduction 4/5  Shoulder extension 4/5  Shoulder internal rotation 5/5  Shoulder external rotation 5/5  Elbow flexion 5/5  Elbow extension 5/5  (Blank rows = not tested)  HAND FUNCTION: Grip strength: Right: 68 lbs; Left: 60 lbs, Lateral pinch: Right: 23 lbs, Left: 23 lbs, and 3 point pinch: Right: 18 lbs, Left: 16 lbs  COORDINATION: 9 Hole Peg test: Right: 29 sec; Left: 34 sec  SENSATION: Light touch: Impaired   EDEMA: No Edema noted  OBSERVATIONS: Coordination with small items is slightly off due to numbness.   TODAY'S TREATMENT:  DATE:  10/28/22 -A/ROM: seated, 2lb wrist weights, flexion, abduction, protraction, horizontal abduction, er/IR, 1x10 -Pinch strengthening: red, green, and blue resistance clips, tripod pinch up, lateral pinch down, x5 each color -Weighted ball: 6lb's, shoulder presses, chest presses, z's, 1x15  -ball passes: chest height x8, shoulder height x8 -PNF Shoulder Strengthening: Carman Ching, Chest pulls, er pulls, PNF up, PNF down, 1x10 -Strengthening: 5lbs, bicep curls, tricep extensions, wrist flexion/extension, radial/ulnar deviation, supination/pronation,  1x10  10/24/22: -A/ROM: seated, 2lb wrist weights, flexion, abduction, protraction, horizontal abduction, er/IR, 1x10 -Scapular Strengthening: Red Theraband, extension, retraction, protraction, rows, 1x15 -Strengthening: 5lbs, bicep curls, tricep extensions, wrist flexion/extension, radial/ulnar deviation, supination/pronation, 1x10 -weighted ball: 4lbs, chest presses, figure 8, truncal rotations, 1x10  10/15/22: -AA/ROM: 3lb dowel, flexion, abduction, protraction, horizontal abduction, er/IR, 1x10 -A/ROM: seated, flexion, abduction, protraction, horizontal abduction, er/IR, 1x10 -Scapular ROM: extension, retraction, rows, 1x10 -Strengthening: 2.5lbs, bicep curls, tricep extensions, 1x10     PATIENT EDUCATION: Education details: PNF Strengthening  Person educated: Patient Education method: Explanation Education comprehension: verbalized understanding  HOME EXERCISE PROGRAM: 11/8: A/ROM and AA/ROM 11/17: Scapula Strengthening 11/21: PNF Strengthening   GOALS: Goals reviewed with patient? Yes  SHORT TERM GOALS: Target date: 11/04/22  Pt will be educated on HEP to improve ability to actively use LUE during functional task completion. Goal status: IN PROGRESS  2.  Pt will increase strength in LUE to 5/5 to improve ability to perform lifting tasks required during cooking and cleaning. Goal status: IN PROGRESS  3.  Pt will increase fine motor coordination in LUE by completing the 9 hole peg test in under 30" to improve ability to perform dressing tasks such as operating buttons and zippers.  Goal status: IN PROGRESS  4.  Pt will increase A/ROM in LUE to [facility] to improve ability to reach overhead and behind back during dressing and bathing tasks.  Goal status: IN PROGRESS   ASSESSMENT:  CLINICAL IMPRESSION: This session continues to work on pt's strengthening, with therapist adding PNF pattern strengthening. Pt continues to have decreased endurance, requiring frequent rest  breaks during each exercise. Therapist providing mod multimodal cuing for technique and positioning during all tasks, as well as continued education on importance of completing HEP at home to continue progressing strength.   PLAN:  OT FREQUENCY: 1x/week  OT DURATION: 4 weeks  PLANNED INTERVENTIONS: self care/ADL training, therapeutic exercise, therapeutic activity, neuromuscular re-education, manual therapy, passive range of motion, balance training, functional mobility training, electrical stimulation, ultrasound, paraffin, moist heat, cryotherapy, patient/family education, and coping strategies training  RECOMMENDED OTHER SERVICES: N/A  CONSULTED AND AGREED WITH PLAN OF CARE: Patient  PLAN FOR NEXT SESSION: Coordination tasks, strengthening tasks, bilateral coordination tasks   Guadalupe Nickless Bing Plume, OTR/L [facility] [phone] Kennyth Arnold, OT 10/28/2022, 9:45 AM

## 2022-10-28 NOTE — Therapy (Signed)
OUTPATIENT PHYSICAL THERAPY NEURO TREATMENT   Patient Name: Randy Ashley MRN: [id number] DOB:Aug 05, 1956, 66 y.o., male Today's Date: 10/28/2022   PCP: Dr. Sudie Bailey REFERRING PROVIDER: Dr. Dietrich Pates   PT End of Session - 10/28/22 1026     Visit Number 5    Number of Visits 12    Date for PT Re-Evaluation 11/21/22    Authorization Type humana    Authorization Time Period 12 approved 11/3-12/15/23    Authorization - Number of Visits 12    Progress Note Due on Visit 10    PT Start Time 0948    PT Stop Time 1026    PT Time Calculation (min) 38 min    Equipment Utilized During Treatment --   SBA   Activity Tolerance Patient tolerated treatment well    Behavior During Therapy [facility] for tasks assessed/performed              Past Medical History:  Diagnosis Date   Chronic back pain    Hypertension    Lumbar radiculopathy    Past Surgical History:  Procedure Laterality Date   HERNIA REPAIR     Patient Active Problem List   Diagnosis Date Noted   Hypertensive emergency 09/06/2022   Acute CVA (cerebrovascular accident) (HCC) 09/06/2022   Uncontrolled hypertension 09/06/2022   Smoker 09/06/2022   Heavy alcohol consumption 09/06/2022   Elevated LFTs 09/06/2022   Thrombocytopenia (HCC) 09/06/2022   Hyperglycemia 09/06/2022   GERD (gastroesophageal reflux disease) 09/06/2022   CLOSED FRACTURE OF BASE OF OTHER METACARPAL BONE 02/10/2011    ONSET DATE: 09/06/2022  REFERRING DIAG:  Diagnosis  I63.9 (ICD-10-CM) - Acute CVA (cerebrovascular accident) (HCC)   THERAPY DIAG:  Muscle weakness  Rationale for Evaluation and Treatment: Rehabilitation  SUBJECTIVE:                                                                                                                                                                                             SUBJECTIVE STATEMENT:  Pt states that he is doing his exercises at home.  He has no questions or concerns.  He is not using  his cane at home.   PERTINENT HISTORY: 66 year old gentleman with recently diagnosed hypertension, daily alcohol consumer, smoker, GERD presents to the ED complaining of left-sided numbness since yesterday morning.  He reportedly was recently diagnosed with hypertension and started on amlodipine 2.5 mg daily from an ED visit on August 05, 2022.  He says he has not been taking it regularly.  He reported that yesterday morning after a shower he started to feel the symptoms of left-sided numbness.  The symptoms  have persisted prompting his visit.  He denies chest pain.  He denies shortness of breath.  He denies vision changes.  He denies rash.  He reportedly has been smoking cigarettes since age 51.  His blood pressure was notably elevated on arrival with a SBP up to 217.  He had a CT brain with no acute findings.  Neurology was consulted and felt that patient can safely stay at [facility].       PAIN:  Are you having pain? No  PRECAUTIONS: Fall  WEIGHT BEARING RESTRICTIONS: No  FALLS: Has patient fallen in last 6 months? No  LIVING ENVIRONMENT: Lives with: lives with their spouse Lives in: House/apartment Stairs: No Has following equipment at home: Single point cane pt was not using the can prior to the stroke.   PLOF: Independent  PATIENT GOALS: TO be able to walk without his cane, to get his LT side better.   OBJECTIVE:   DIAGNOSTIC FINDINGS: IMPRESSION: MRI brain:   1. 7 mm acute infarct within the dorsal right pons. 2. 4 mm acute infarct within the right thalamus. 3. 4 mm acute/early subacute infarct within the right frontoparietal periventricular white matter. 4. 6 mm acute/early subacute infarct within the posterior right frontal lobe white matter. 5. 4 mm acute/early subacute infarct within the anterior right subinsular white matter 6. Given that the above described infarcts involve multiple vascular territories, consider an embolic process. 7. 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   MRA head:   1. No intracranial large vessel occlusion is identified. 2. Intracranial atherosclerotic disease. Most notably, there are apparent moderate/severe stenoses within the bilateral posterior cerebral arteries at the P2/P3 junctions.     Electronically Signed   By: Jackey Loge D.O.   On: 09/08/2022 14:09  COGNITION: Overall cognitive status: Within functional limits for tasks assessed   SENSATION: Not tested   POSTURE: rounded shoulders, forward head, decreased lumbar lordosis, and increased thoracic kyphosis    LOWER EXTREMITY MMT:    MMT Right Eval Left Eval  Hip flexion 5/5 3-/5  Hip extension 5/5 4/5  Hip abduction 5/5 3+/5  Hip adduction    Hip internal rotation    Hip external rotation    Knee flexion 5/5 5/5  Knee extension 5/5 4/5  Ankle dorsiflexion 5/5 4/5  Ankle plantarflexion    Ankle inversion    Ankle eversion    (Blank rows = not tested)    TODAY'S TREATMENT:                                                                                                                              DATE:  10/28/22 Standing:   Heel raises x 15 Functional squat x 15 4" step up Gt without cane Stairs in stairwell 20 steps reciprocal manner. Side step with blue theraband x 3 RT Lt hip abduction, flexion and extension with blue T band x  15 Lt knee flexion with blue theraband x 10 Tandem stance on foam x 5 with Rt foot forward then left Sitting  Sit to stand x 15  Knee extension blue theraband x 15 Quadriped: Single arm raise B x 3 Single leg raise B x 3 10/24/22 Squat front of chair for mechanics 2x 10 Heel raises 15 Toe raises 15x Sidestep with RTB around thighs 2RT Toe tapping with opposite UE/LE 6in step height SLS Lt 6", Rt 12" max of 3 3D hip excursion (Weight shifting, rotation, STS) Tandem stance 2x 30" intermittent HHA  Quadruped Opposite UE/LE 3x  5" cueing for coordination  10/21/22: Rockerboard lateral x   DF/PF x 1 min with 1 HHA Heel raises 15x Toe raises 15 Controlled STS no HHA 10x Marching 10x 3" alternating with minimal HHA 3D hip excursion (Weight shifting, rotation, STS)  10/16/22 SLR 2 x 10 Bridge 2 x 10 Sidelying hip abduction 2 x 10  Heel raise 2 x 10 Toe raise 2 x 10 Standing hip abduction 2 x10 Standing hip extension 2 x10 Sit to stand x 10 Step up 4 inch 2 x 10 HHA x 1   10/10/2022 Evaluation: sit to stand 10                    Slr x 10                    Bridge x 10                    Lt hip abduction x 10                         PATIENT EDUCATION: Education details: HEP Person educated: Patient Education method: Chief Technology Officer Education comprehension: returned demonstration  HOME EXERCISE PROGRAM: Access Code: [email or web address] URL: [email or web address] Date: 10/10/2022 Prepared by: Virgina Organ  10/24/22: squat, heel raise and SLS  Exercises - Sit to Stand with Armchair  - 3 x daily - 7 x weekly - 1 sets - 10 reps - 3" hold - Active Straight Leg Raise with Quad Set  - 3 x daily - 7 x weekly - 1 sets - 10 reps - 3" hold - Supine Bridge  - 3 x daily - 7 x weekly - 1 sets - 10 reps - 3" hold             - Sidelying Hip Abduction  - 3 x daily - 7 x weekly - 1 sets - 10 reps - 3" hold   GOALS: Goals reviewed with patient? No  SHORT TERM GOALS: Target date: 10/30/2022  Pt to be I in HEP to improve strength by 1/2 grade to be able to go up and down 4 steps with a hand rail  Baseline: Goal status: IN PROGRESS  2.  Pt to be able to single leg stance B x 10 seconds to be able to walk indoor without assistive device to reduce risk of falling.  Baseline:  Goal status: IN PROGRESS    LONG TERM GOALS: Target date: 11/13/2022  PT to be I in an advanced HEP in order to increase mm strength one grade to be abel to go up and down 4 steps in a reciprocal manner.    Baseline:  Goal status: IN PROGRESS  2.  Pt to improve balance to feel confident walking outside without an assistive device  Baseline:  Goal status:  IN PROGRESS    ASSESSMENT:  CLINICAL IMPRESSION: Session continues to work on strengthening and gt without assistive device as well as balance. Pt challenged by balance activity.   PT needs verbal cuing for proper technique along with short rest breaks.  Attempted quadriped opposite arm/leg raise however pt was unable to complete with good form therefore modified to single arm, single leg raise.  Pt will continue to benefit from skilled PT to return to prior level of functioning working part time at [employer].     OBJECTIVE IMPAIRMENTS: cardiopulmonary status limiting activity, decreased activity tolerance, decreased balance, difficulty walking, and decreased strength.   ACTIVITY LIMITATIONS: carrying, lifting, standing, squatting, and locomotion level  PARTICIPATION LIMITATIONS: cleaning, shopping, and community activity  PERSONAL FACTORS: 2 comorbidity: HTN, smoking are also affecting patient's functional outcome.   REHAB POTENTIAL: Good  CLINICAL DECISION MAKING: Stable/uncomplicated  EVALUATION COMPLEXITY: Low  PLAN:  PT FREQUENCY: 2x/week  PT DURATION: 6 weeks  PLANNED INTERVENTIONS: Therapeutic exercises, Therapeutic activity, Neuromuscular re-education, Balance training, Gait training, Patient/Family education, and Self Care  PLAN FOR NEXT SESSION: Continue with balance and wt bearing strengthening exercises.   Virgina Organynthia Danasha Melman, PT CLT [phone]  [phone]

## 2022-11-04 ENCOUNTER — Ambulatory Visit (HOSPITAL_COMMUNITY): Payer: Medicare HMO | Admitting: Physical Therapy

## 2022-11-04 ENCOUNTER — Encounter (HOSPITAL_COMMUNITY): Payer: Self-pay | Admitting: Occupational Therapy

## 2022-11-04 ENCOUNTER — Ambulatory Visit (HOSPITAL_COMMUNITY): Payer: Medicare HMO | Admitting: Occupational Therapy

## 2022-11-04 DIAGNOSIS — R278 Other lack of coordination: Secondary | ICD-10-CM

## 2022-11-04 DIAGNOSIS — R29818 Other symptoms and signs involving the nervous system: Secondary | ICD-10-CM

## 2022-11-04 DIAGNOSIS — R262 Difficulty in walking, not elsewhere classified: Secondary | ICD-10-CM

## 2022-11-04 DIAGNOSIS — M25612 Stiffness of left shoulder, not elsewhere classified: Secondary | ICD-10-CM

## 2022-11-04 DIAGNOSIS — M6281 Muscle weakness (generalized): Secondary | ICD-10-CM

## 2022-11-04 NOTE — Therapy (Signed)
OUTPATIENT PHYSICAL THERAPY NEURO TREATMENT   Patient Name: Randy Ashley MRN: [id number] DOB:May 24, 1956, 66 y.o., male Today's Date: 11/04/2022   PCP: Dr. Karie Kirks REFERRING PROVIDER: Dr. Dorris Carnes   PT End of Session - 11/04/22 1030    Visit Number 6    Number of Visits 12    Date for PT Re-Evaluation 11/21/22    Authorization Type humana    Authorization Time Period 12 approved 11/3-12/15/23    Authorization - Visit Number 5    Authorization - Number of Visits 12    Progress Note Due on Visit 10    PT Start Time 0949    PT Stop Time 1028    PT Time Calculation (min) 39 min    Equipment Utilized During Treatment --   SBA   Activity Tolerance Patient tolerated treatment well    Behavior During Therapy [facility] for tasks assessed/performed              Past Medical History:  Diagnosis Date   Chronic back pain    Hypertension    Lumbar radiculopathy    Past Surgical History:  Procedure Laterality Date   HERNIA REPAIR     Patient Active Problem List   Diagnosis Date Noted   Hypertensive emergency 09/06/2022   Acute CVA (cerebrovascular accident) ([place]) 09/06/2022   Uncontrolled hypertension 09/06/2022   Smoker 09/06/2022   Heavy alcohol consumption 09/06/2022   Elevated LFTs 09/06/2022   Thrombocytopenia ([place]) 09/06/2022   Hyperglycemia 09/06/2022   GERD (gastroesophageal reflux disease) 09/06/2022   CLOSED FRACTURE OF BASE OF OTHER METACARPAL BONE 02/10/2011    ONSET DATE: 09/06/2022  REFERRING DIAG:  Diagnosis  I63.9 (ICD-10-CM) - Acute CVA (cerebrovascular accident) ([place])   THERAPY DIAG:  Muscle weakness  Rationale for Evaluation and Treatment: Rehabilitation  SUBJECTIVE:                                                                                                                                                                                             SUBJECTIVE STATEMENT:  Pt states that his main complaint is what he has been telling us all  along he feels very stiff and the exercises are not helping that.  PERTINENT HISTORY: 66 year old gentleman with recently diagnosed hypertension, daily alcohol consumer, smoker, GERD presents to the ED complaining of left-sided numbness since yesterday morning.  He reportedly was recently diagnosed with hypertension and started on amlodipine 2.5 mg daily from an ED visit on August 05, 2022.  He says he has not been taking it regularly.  He reported that yesterday morning after a shower he started to feel the symptoms  of left-sided numbness.  The symptoms have persisted prompting his visit.  He denies chest pain.  He denies shortness of breath.  He denies vision changes.  He denies rash.  He reportedly has been smoking cigarettes since age 47.  His blood pressure was notably elevated on arrival with a SBP up to 217.  He had a CT brain with no acute findings.  Neurology was consulted and felt that patient can safely stay at [facility].       PAIN:  Are you having pain? No  PRECAUTIONS: Fall  WEIGHT BEARING RESTRICTIONS: No  FALLS: Has patient fallen in last 6 months? No  LIVING ENVIRONMENT: Lives with: lives with their spouse Lives in: House/apartment Stairs: No Has following equipment at home: Single point cane pt was not using the can prior to the stroke.   PLOF: Independent  PATIENT GOALS: TO be able to walk without his cane, to get his LT side better.   OBJECTIVE:   DIAGNOSTIC FINDINGS: IMPRESSION: MRI brain:   1. 7 mm acute infarct within the dorsal right pons. 2. 4 mm acute infarct within the right thalamus. 3. 4 mm acute/early subacute infarct within the right frontoparietal periventricular white matter. 4. 6 mm acute/early subacute infarct within the posterior right frontal lobe white matter. 5. 4 mm acute/early subacute infarct within the anterior right subinsular white matter 6. Given that the above described infarcts involve multiple vascular territories, consider an  embolic process. 7. Background chronic small vessel ischemic disease with multiple chronic small-vessel infarcts, as described.   MRA head:   1. No intracranial large vessel occlusion is identified. 2. Intracranial atherosclerotic disease. Most notably, there are apparent moderate/severe stenoses within the bilateral posterior cerebral arteries at the P2/P3 junctions.     Electronically Signed   By: Kellie Simmering D.O.   On: 09/08/2022 14:09  COGNITION: Overall cognitive status: Within functional limits for tasks assessed   SENSATION: Not tested   POSTURE: rounded shoulders, forward head, decreased lumbar lordosis, and increased thoracic kyphosis    LOWER EXTREMITY MMT:    MMT Right Eval Left Eval  Hip flexion 5/5 3-/5  Hip extension 5/5 4/5  Hip abduction 5/5 3+/5  Hip adduction    Hip internal rotation    Hip external rotation    Knee flexion 5/5 5/5  Knee extension 5/5 4/5  Ankle dorsiflexion 5/5 4/5  Ankle plantarflexion    Ankle inversion    Ankle eversion    (Blank rows = not tested)    TODAY'S TREATMENT:                                                                                                                              DATE:  11/04/22 Supine: Knee to chest 3 x 30" LTR x 5 Active hamstring stretch  3 x 30"  Piriformis stretch x 3  Sitting thoracic excursion x 3  Standing : Hip excursion x 3 Slant board  x 3 x 30"  PATIENT EDUCATION: Education details: HEP Person educated: Patient Education method: Theatre stage manager Education comprehension: returned demonstration  HOME EXERCISE PROGRAM:            11/04/22            - Supine Lower Trunk Rotation  - 1 x daily - 7 x weekly - 1 sets - 10 reps - 3-5" hold - Supine Single Knee to Chest Stretch  - 1 x daily - 7 x weekly - 1 sets - 10 reps - 20" hold - Supine Hamstring Stretch  - 1 x daily - 7 x weekly - 1 sets - 10 reps - 20" hold - Supine Piriformis Stretch with Foot on Ground  - 1  x daily - 7 x weekly - 1 sets - 10 reps - 20-30" hold - Seated Long Arc Quad  - 1 x daily - 7 x weekly - 1 sets - 10 reps Access Code: [email or web address] URL: [email or web address] Date: 10/10/2022 Prepared by: Rayetta Humphrey  10/24/22: squat, heel raise and SLS  Exercises - Sit to Stand with Armchair  - 3 x daily - 7 x weekly - 1 sets - 10 reps - 3" hold - Active Straight Leg Raise with Quad Set  - 3 x daily - 7 x weekly - 1 sets - 10 reps - 3" hold - Supine Bridge  - 3 x daily - 7 x weekly - 1 sets - 10 reps - 3" hold             - Sidelying Hip Abduction  - 3 x daily - 7 x weekly - 1 sets - 10 reps - 3" hold   GOALS: Goals reviewed with patient? No  SHORT TERM GOALS: Target date: 10/30/2022  Pt to be I in HEP to improve strength by 1/2 grade to be able to go up and down 4 steps with a hand rail  Baseline: Goal status: IN PROGRESS  2.  Pt to be able to single leg stance B x 10 seconds to be able to walk indoor without assistive device to reduce risk of falling.  Baseline:  Goal status: IN PROGRESS    LONG TERM GOALS: Target date: 11/13/2022  PT to be I in an advanced HEP in order to increase mm strength one grade to be abel to go up and down 4 steps in a reciprocal manner.   Baseline:  Goal status: IN PROGRESS  2.  Pt to improve balance to feel confident walking outside without an assistive device  Baseline:  Goal status: IN PROGRESS    ASSESSMENT:  CLINICAL IMPRESSION: PT comes to department complaining that the stiffness that he feels is not being relieved with the exercises given.  Treatment focused on stretching this treatment period with HEP updated.   Pt will continue to benefit from skilled PT to return to prior level of functioning working part time at [employer].     OBJECTIVE IMPAIRMENTS: cardiopulmonary status limiting activity, decreased activity tolerance, decreased balance, difficulty walking, and decreased strength.   ACTIVITY LIMITATIONS: carrying,  lifting, standing, squatting, and locomotion level  PARTICIPATION LIMITATIONS: cleaning, shopping, and community activity  PERSONAL FACTORS: 2 comorbidity: HTN, smoking are also affecting patient's functional outcome.   REHAB POTENTIAL: Good  CLINICAL DECISION MAKING: Stable/uncomplicated  EVALUATION COMPLEXITY: Low  PLAN:  PT FREQUENCY: 2x/week  PT DURATION: 6 weeks  PLANNED INTERVENTIONS: Therapeutic exercises, Therapeutic activity, Neuromuscular re-education, Balance training, Gait training, Patient/Family education, and  Self Care  PLAN FOR NEXT SESSION: See if stretches helped pt's feeling of stiffness.  Continue with balance and wt bearing strengthening exercises.   Virgina Organ, PT CLT [phone]  [phone]

## 2022-11-04 NOTE — Therapy (Signed)
OUTPATIENT OCCUPATIONAL THERAPY NEURO REASSESSMENT NOTE & DISCHARGE SUMMARY  Patient Name: Randy Ashley MRN: [id number] DOB:Feb 24, 1956, 66 y.o., male Today's Date: 11/04/2022  PCP: Lemmie Evens, MD REFERRING PROVIDER: Dorris Carnes, MD   OT End of Session - 11/04/22 1058     Visit Number 5    Number of Visits 5    Date for OT Re-Evaluation 11/04/22    Authorization Type [employer], $20 copay    Authorization Time Period No visit limit    OT Start Time 1031    OT Stop Time 1045    OT Time Calculation (min) 14 min    Activity Tolerance Patient tolerated treatment well    Behavior During Therapy [facility] for tasks assessed/performed             Past Medical History:  Diagnosis Date   Chronic back pain    Hypertension    Lumbar radiculopathy    Past Surgical History:  Procedure Laterality Date   HERNIA REPAIR     Patient Active Problem List   Diagnosis Date Noted   Hypertensive emergency 09/06/2022   Acute CVA (cerebrovascular accident) ([place]) 09/06/2022   Uncontrolled hypertension 09/06/2022   Smoker 09/06/2022   Heavy alcohol consumption 09/06/2022   Elevated LFTs 09/06/2022   Thrombocytopenia ([place]) 09/06/2022   Hyperglycemia 09/06/2022   GERD (gastroesophageal reflux disease) 09/06/2022   CLOSED FRACTURE OF BASE OF OTHER METACARPAL BONE 02/10/2011    ONSET DATE: 09/06/2022  REFERRING DIAG: R CVA with L sided weakness  THERAPY DIAG:  Other lack of coordination  Stiffness of left shoulder, not elsewhere classified  Other symptoms and signs involving the nervous system  Rationale for Evaluation and Treatment: Rehabilitation  SUBJECTIVE:   SUBJECTIVE STATEMENT: "Exercises are going great, but the numbness is not leaving me."  PRECAUTIONS: Fall  WEIGHT BEARING RESTRICTIONS: No  PAIN:  Are you having pain? No  FALLS: Has patient fallen in last 6 months? No  PATIENT GOALS: I want to get back like I was.  OBJECTIVE:   HAND DOMINANCE:  Left  ADLs: Overall ADLs: Limited with cooking and cleaning, difficulty with bathing tasks. Unable to work at this time.  MOBILITY STATUS:  Supervision with cane  POSTURE COMMENTS:  No Significant postural limitations Sitting balance: Moves/returns truncal midpoint >2 inches in all planes  ACTIVITY TOLERANCE: Activity tolerance: Limited due to strength and endurance  FUNCTIONAL OUTCOME MEASURES: FOTO: 51.17 11/04/22: 62/100  UPPER EXTREMITY ROM:    Active ROM Left eval Left 11/04/22  Shoulder flexion 143 174  Shoulder abduction 136 161  Shoulder internal rotation 90 90  Shoulder external rotation 68 68  (Blank rows = not tested)  UPPER EXTREMITY MMT:     MMT Left eval Left 11/04/22  Shoulder flexion 4/5 5/5  Shoulder abduction 4+/5 5/5  Shoulder adduction 4/5 5/5  Shoulder extension 4/5 5/5  Shoulder internal rotation 5/5   Shoulder external rotation 5/5   Elbow flexion 5/5   Elbow extension 5/5   (Blank rows = not tested)  HAND FUNCTION: Grip strength: Right: 68 lbs; Left: 60 lbs, Lateral pinch: Right: 23 lbs, Left: 23 lbs, and 3 point pinch: Right: 18 lbs, Left: 16 lbs 11/04/22:Grip strength: Right: 92 lbs; Left: 85 lbs, Lateral pinch: Left: 24 lbs, and 3 point pinch: Left: 16 lbs   COORDINATION: 9 Hole Peg test: Right: 29 sec; Left: 34 sec 11/04/22: 9 Hole Peg test: Left: 28.44 sec  SENSATION: Light touch: Impaired   OBSERVATIONS: Coordination with small items is  slightly off due to numbness.   TODAY'S TREATMENT:                                                                                                                              DATE:   11/04/22 -Discussed importance of using vision to compensate for sensation deficits during functional tasks. Discussed HEP completion and importance of continuing. Pt questioning ability to return to work, OT noting good use of LUE, informing pt that his job will likely require a doctor's release and recommend pt  schedule a follow up appt.    10/28/22 -A/ROM: seated, 2lb wrist weights, flexion, abduction, protraction, horizontal abduction, er/IR, 1x10 -Pinch strengthening: red, green, and blue resistance clips, tripod pinch up, lateral pinch down, x5 each color -Weighted ball: 6lb's, shoulder presses, chest presses, z's, 1x15  -ball passes: chest height x8, shoulder height x8 -PNF Shoulder Strengthening: Marga Hoots, Chest pulls, er pulls, PNF up, PNF down, 1x10 -Strengthening: 5lbs, bicep curls, tricep extensions, wrist flexion/extension, radial/ulnar deviation, supination/pronation, 1x10  10/24/22: -A/ROM: seated, 2lb wrist weights, flexion, abduction, protraction, horizontal abduction, er/IR, 1x10 -Scapular Strengthening: Red Theraband, extension, retraction, protraction, rows, 1x15 -Strengthening: 5lbs, bicep curls, tricep extensions, wrist flexion/extension, radial/ulnar deviation, supination/pronation, 1x10 -weighted ball: 4lbs, chest presses, figure 8, truncal rotations, 1x10    PATIENT EDUCATION: Education details: use of vision Person educated: Patient Education method: Explanation Education comprehension: verbalized understanding  HOME EXERCISE PROGRAM: 11/8: A/ROM and AA/ROM 11/17: Scapula Strengthening 11/21: PNF Strengthening   GOALS: Goals reviewed with patient? Yes  SHORT TERM GOALS: Target date: 11/04/22  Pt will be educated on HEP to improve ability to actively use LUE during functional task completion. Goal status: MET  2.  Pt will increase strength in LUE to 5/5 to improve ability to perform lifting tasks required during cooking and cleaning. Goal status: MET  3.  Pt will increase fine motor coordination in LUE by completing the 9 hole peg test in under 30" to improve ability to perform dressing tasks such as operating buttons and zippers.  Goal status: MET  4.  Pt will increase A/ROM in LUE to [facility] to improve ability to reach overhead and behind back during  dressing and bathing tasks.  Goal status: MET   ASSESSMENT:  CLINICAL IMPRESSION: Reassessment completed this session, pt has met all goals and demonstrates great ROM and strength in LUE. Pt has improved functional use, reports continued numbness that is limiting his ability to complete tasks at his prior level of functioning. Discussed need to use vision more to compensate for lack of sensation in his LUE, pt verbalized understanding. Discussed scheduling a follow up with his MD to discuss returning to work. Pt is agreeable to discharge today with plan for continued HEP completion.   PLAN:  OT FREQUENCY: 1x/week  OT DURATION: 4 weeks  PLANNED INTERVENTIONS: self care/ADL training, therapeutic exercise, therapeutic activity, neuromuscular re-education, manual therapy, passive range of motion, balance training, functional mobility training,  electrical stimulation, ultrasound, paraffin, moist heat, cryotherapy, patient/family education, and coping strategies training  RECOMMENDED OTHER SERVICES: N/A  CONSULTED AND AGREED WITH PLAN OF CARE: Patient  PLAN FOR NEXT SESSION: N/A-discharge pt   OCCUPATIONAL THERAPY DISCHARGE SUMMARY  Visits from Start of Care: 5  Current functional level related to goals / functional outcomes: See above. Pt has met all goals demonstrates excellent ROM and strength in LUE.    Remaining deficits: Sensation deficits in LUE   Education / Equipment: HEP, education on increased use of vision during tasks   Patient agrees to discharge. Patient goals were met. Patient is being discharged due to meeting the stated rehab goals.Guadelupe Sabin, OTR/L  [phone] 11/04/2022, 10:58 AM

## 2022-11-06 ENCOUNTER — Encounter (HOSPITAL_COMMUNITY): Payer: Self-pay

## 2022-11-06 ENCOUNTER — Ambulatory Visit (HOSPITAL_COMMUNITY): Payer: Medicare HMO

## 2022-11-06 DIAGNOSIS — R262 Difficulty in walking, not elsewhere classified: Secondary | ICD-10-CM | POA: Diagnosis not present

## 2022-11-06 DIAGNOSIS — M6281 Muscle weakness (generalized): Secondary | ICD-10-CM

## 2022-11-06 NOTE — Therapy (Addendum)
OUTPATIENT PHYSICAL THERAPY NEURO TREATMENT   Patient Name: Randy Ashley MRN: [id number] DOB:1956-07-22, 66 y.o., male Today's Date: 11/06/2022 PHYSICAL THERAPY DISCHARGE SUMMARY  Visits from Start of Care: 7  Current functional level related to goals / functional outcomes: See below   Remaining deficits: See below   Education / Equipment: HEP   Patient agrees to discharge. Patient goals were partially met. Patient is being discharged due to being pleased with the current functional level.   PCP: Dr. Karie Kirks REFERRING PROVIDER: Dr. Dorris Carnes    END OF SESSION:   PT End of Session - 11/06/22 0954     Visit Number 7    Number of Visits 7   Date for PT Re-Evaluation 11/21/22    Authorization Type humana    Authorization Time Period 12 approved 11/3-12/15/23    Authorization - Visit Number 6    Authorization - Number of Visits 12    Progress Note Due on Visit 10    PT Start Time 0950    PT Stop Time 1028    PT Time Calculation (min) 38 min    Activity Tolerance Patient tolerated treatment well    Behavior During Therapy [facility] for tasks assessed/performed              Past Medical History:  Diagnosis Date   Chronic back pain    Hypertension    Lumbar radiculopathy    Past Surgical History:  Procedure Laterality Date   HERNIA REPAIR     Patient Active Problem List   Diagnosis Date Noted   Hypertensive emergency 09/06/2022   Acute CVA (cerebrovascular accident) ([place]) 09/06/2022   Uncontrolled hypertension 09/06/2022   Smoker 09/06/2022   Heavy alcohol consumption 09/06/2022   Elevated LFTs 09/06/2022   Thrombocytopenia ([place]) 09/06/2022   Hyperglycemia 09/06/2022   GERD (gastroesophageal reflux disease) 09/06/2022   CLOSED FRACTURE OF BASE OF OTHER METACARPAL BONE 02/10/2011    ONSET DATE: 09/06/2022  REFERRING DIAG:  Diagnosis  I63.9 (ICD-10-CM) - Acute CVA (cerebrovascular accident) [facility])   MD apt with Dr Marga Hoots 12/01 THERAPY DIAG:   Muscle weakness  Rationale for Evaluation and Treatment: Rehabilitation  SUBJECTIVE:                                                                                                                                                                                             SUBJECTIVE STATEMENT: Pt stated today is graduation day, doesn't have any more apts.  Pt stated he continues to have numbness Lt LE that hasn't changed.  Felt the stretches were helpful but continues to have stiffness.    PERTINENT  HISTORY: 66 year old gentleman with recently diagnosed hypertension, daily alcohol consumer, smoker, GERD presents to the ED complaining of left-sided numbness since yesterday morning.  He reportedly was recently diagnosed with hypertension and started on amlodipine 2.5 mg daily from an ED visit on August 05, 2022.  He says he has not been taking it regularly.  He reported that yesterday morning after a shower he started to feel the symptoms of left-sided numbness.  The symptoms have persisted prompting his visit.  He denies chest pain.  He denies shortness of breath.  He denies vision changes.  He denies rash.  He reportedly has been smoking cigarettes since age 22.  His blood pressure was notably elevated on arrival with a SBP up to 217.  He had a CT brain with no acute findings.  Neurology was consulted and felt that patient can safely stay at [facility].       PAIN:  Are you having pain? No  PRECAUTIONS: Fall  WEIGHT BEARING RESTRICTIONS: No  FALLS: Has patient fallen in last 6 months? No  LIVING ENVIRONMENT: Lives with: lives with their spouse Lives in: House/apartment Stairs: No Has following equipment at home: Single point cane pt was not using the can prior to the stroke.   PLOF: Independent  PATIENT GOALS: TO be able to walk without his cane, to get his LT side better.   OBJECTIVE:   DIAGNOSTIC FINDINGS: IMPRESSION: MRI brain:   1. 7 mm acute infarct within the dorsal right  pons. 2. 4 mm acute infarct within the right thalamus. 3. 4 mm acute/early subacute infarct within the right frontoparietal periventricular white matter. 4. 6 mm acute/early subacute infarct within the posterior right frontal lobe white matter. 5. 4 mm acute/early subacute infarct within the anterior right subinsular white matter 6. Given that the above described infarcts involve multiple vascular territories, consider an embolic process. 7. Background chronic small vessel ischemic disease with multiple chronic small-vessel infarcts, as described.   MRA head:   1. No intracranial large vessel occlusion is identified. 2. Intracranial atherosclerotic disease. Most notably, there are apparent moderate/severe stenoses within the bilateral posterior cerebral arteries at the P2/P3 junctions.     Electronically Signed   By: Kellie Simmering D.O.   On: 09/08/2022 14:09  COGNITION: Overall cognitive status: Within functional limits for tasks assessed   SENSATION: Not tested   POSTURE: rounded shoulders, forward head, decreased lumbar lordosis, and increased thoracic kyphosis    LOWER EXTREMITY MMT:    MMT Right Eval Left Eval Left 11/30 Right 11/30  Hip flexion 5/5 3-/5 5/5 5/5  Hip extension 5/5 4/5 4+ 5/5  Hip abduction 5/5 3+/5 5/5 5/5  Hip adduction      Hip internal rotation      Hip external rotation      Knee flexion 5/5 5/5 5/5 5/5  Knee extension 5/5 4/5 5/5 5/5  Ankle dorsiflexion 5/5 4/5 5/5 5/5  Ankle plantarflexion      Ankle inversion      Ankle eversion      (Blank rows = not tested)  FUNCTIONAL TESTS:  Eval: 30 seconds chair stand test:  7 2 minute walk test: 255' Single leg stance:  RT:  0"  , Lt:  7"  11/06/22:  2MWT 450f no AD, no LOB 2RT reciprocal pattern stairs no HR assistance 5STS no HHA 12.34" SLS 5-8" max BLE  TODAY'S TREATMENT:  DATE:  11/06/22: MMT see above 2MWT 433f no AD, no LOB 2RT reciprocal pattern stairs no HR assistance 5STS no HHA 12.34" SLS 5" each max of 5 Sidestep GTB 3RT down hallway  Vector stance 3x 5" Tandem stance 2x 30"  11/04/22 Supine: Knee to chest 3 x 30" LTR x 5 Active hamstring stretch  3 x 30"  Piriformis stretch x 3  Sitting thoracic excursion x 3  Standing : Hip excursion x 3 Slant board x 3 x 30"  PATIENT EDUCATION: Education details: HEP Person educated: Patient Education method: ETheatre stage managerEducation comprehension: returned demonstration  HOME EXERCISE PROGRAM:            11/06/22: sidestep GTB, vector stance and tandem stance  11/04/22            - Supine Lower Trunk Rotation  - 1 x daily - 7 x weekly - 1 sets - 10 reps - 3-5" hold - Supine Single Knee to Chest Stretch  - 1 x daily - 7 x weekly - 1 sets - 10 reps - 20" hold - Supine Hamstring Stretch  - 1 x daily - 7 x weekly - 1 sets - 10 reps - 20" hold - Supine Piriformis Stretch with Foot on Ground  - 1 x daily - 7 x weekly - 1 sets - 10 reps - 20-30" hold - Seated Long Arc Quad  - 1 x daily - 7 x weekly - 1 sets - 10 reps Access Code: [email or web address]: [email or web address] Date: 10/10/2022 Prepared by: CRayetta Humphrey 10/24/22: squat, heel raise and SLS  Exercises - Sit to Stand with Armchair  - 3 x daily - 7 x weekly - 1 sets - 10 reps - 3" hold - Active Straight Leg Raise with Quad Set  - 3 x daily - 7 x weekly - 1 sets - 10 reps - 3" hold - Supine Bridge  - 3 x daily - 7 x weekly - 1 sets - 10 reps - 3" hold             - Sidelying Hip Abduction  - 3 x daily - 7 x weekly - 1 sets - 10 reps - 3" hold   GOALS: Goals reviewed with patient? No  SHORT TERM GOALS: Target date: 10/30/2022  Pt to be I in HEP to improve strength by 1/2 grade to be able to go up and down 4 steps with a hand rail  Baseline: 11/30- see above Goal status: MET  2.  Pt to be able to  single leg stance B x 10 seconds to be able to walk indoor without assistive device to reduce risk of falling.  Baseline: 11/30/2: Lt 5", Rt 5" max of 5 Goal status: IN PROGRESS    LONG TERM GOALS: Target date: 11/13/2022  PT to be I in an advanced HEP in order to increase mm strength one grade to be abel to go up and down 4 steps in a reciprocal manner.   Baseline: 11/30:  Able to complete 2 RT reciprocal pattern 7in stair height with no HR assistance.  Reports compliance with HEP Goal status: IN PROGRESS  2.  Pt to improve balance to feel confident walking outside without an assistive device  Baseline: 11/06/22: Reports confidence walking outside with no AD, no hx of falls. Goal status: MET    ASSESSMENT:  CLINICAL IMPRESSION: Pt stated his main problem has been with numbness on Lt LE that hasn't changed, stated he  has added the new stretches from last session but continues to be stiff.  Pt stated he feels ready for discharge.  Reviewed goals with objective testing complete with vast improvements noted with MMT, 2MWT, and STS testing.  Pt with increased cadence during 2MWT with no AD and no LOB.  Pt able to ambulate reciprocal pattern stairs with no HR required and stable mechanics.  Pt main deficits with balance, unable to complete greater that 5" SLS.  Reviewed importance of improving balance for fall prevention.  Pt wishes for DC to HEP.  Educated importance of continuing HEP  OBJECTIVE IMPAIRMENTS: cardiopulmonary status limiting activity, decreased activity tolerance, decreased balance, difficulty walking, and decreased strength.   ACTIVITY LIMITATIONS: carrying, lifting, standing, squatting, and locomotion level  PARTICIPATION LIMITATIONS: cleaning, shopping, and community activity  PERSONAL FACTORS: 2 comorbidity: HTN, smoking are also affecting patient's functional outcome.   REHAB POTENTIAL: Good  CLINICAL DECISION MAKING: Stable/uncomplicated  EVALUATION COMPLEXITY:  Low  PLAN:  PT FREQUENCY: 2x/week  PT DURATION: 6 weeks  PLANNED INTERVENTIONS: Therapeutic exercises, Therapeutic activity, Neuromuscular re-education, Balance training, Gait training, Patient/Family education, and Self Care  PLAN FOR NEXT SESSION: DC to HEP.  Ihor Austin, LPTA/CLT; [place] Rayetta Humphrey, [place] CLT [phone]

## 2023-01-18 NOTE — Progress Notes (Unsigned)
Cardiology Office Note   Date:  01/19/2023   ID:  Randy Ashley, Randy Ashley 1956/12/06, MRN [id number]  PCP:  Lemmie Evens, MD  Cardiologist:   Dorris Carnes, MD   Pt presents for following HTN     History of Present Illness: Randy Ashley is a 67 y.o. male with a history of HTN, CVA, alcohol abuse    He was admitted on 09/08/22 with L sided numbness.    BP was severely elevated       The pt had an MRI that showed multiple punctate lesions   Echo done with bubble study     LVEF normal   Bubble study negative       I saw the pt in clinic in Oct 2023   30 day monitor showed normal SR   NO afib    Since seen he denies CP  no SOB  No palpitations     Still weak on left side   Did some rehab.   Has not been seen in neuro Working at [employer] to eating sweets a lot   Drinking sweet tea   Current Meds  Medication Sig   amLODipine (NORVASC) 10 MG tablet Take 1 tablet (10 mg total) by mouth daily.   aspirin 81 MG chewable tablet Chew 1 tablet (81 mg total) by mouth daily.   atorvastatin (LIPITOR) 40 MG tablet Take 1 tablet (40 mg total) by mouth every evening.   folic acid (FOLVITE) 1 MG tablet Take 1 tablet (1 mg total) by mouth daily.   lansoprazole (PREVACID) 30 MG capsule Take 1 capsule (30 mg total) by mouth daily.   losartan (COZAAR) 50 MG tablet Take 1 tablet (50 mg total) by mouth daily.   meclizine (ANTIVERT) 25 MG tablet Take 25 mg by mouth every 8 (eight) hours as needed for dizziness.   Multiple Vitamin (MULTIVITAMIN WITH MINERALS) TABS tablet Take 1 tablet by mouth daily.   thiamine (VITAMIN B-1) 100 MG tablet Take 1 tablet (100 mg total) by mouth daily.     Allergies:   Patient has no known allergies.   Past Medical History:  Diagnosis Date   Chronic back pain    Hypertension    Lumbar radiculopathy     Past Surgical History:  Procedure Laterality Date   HERNIA REPAIR       Social History:  The patient  reports that he has been smoking cigarettes. He  has been smoking an average of 1 pack per day. He has never used smokeless tobacco. He reports that he does not currently use alcohol. He reports that he does not use drugs.   Family History:  The patient's family history includes Arthritis in an other family member; Diabetes in an other family member; Heart disease in an other family member.    ROS:  Please see the history of present illness. All other systems are reviewed and  Negative to the above problem except as noted.    PHYSICAL EXAM: VS:  BP (!) 140/66   Pulse 83   Ht 5' 6"$  (1.676 m)   Wt 153 lb 12.8 oz (69.8 kg)   SpO2 98%   BMI 24.82 kg/m   GEN: Well nourished, well developed, in no acute distress  HEENT: normal  Neck: no JVD, carotid bruit Cardiac: RRR; no murmur  No LE edema  Respiratory:  clear to auscultation bilaterally GI: soft, nontender, nondistended, + BS  No hepatomegaly  MS: no deformity Moving all extremities  Skin: warm and dry, no rash Neuro: Deferred full exam   Moving all extremites    Psych: euthymic mood, full affect   EKG:  EKG is not  ordered today.   Lipid Panel    Component Value Date/Time   CHOL 133 09/08/2022 1327   TRIG 157 (H) 09/08/2022 1327   HDL 23 (L) 09/08/2022 1327   CHOLHDL 5.8 09/08/2022 1327   VLDL 31 09/08/2022 1327   LDLCALC 79 09/08/2022 1327      Wt Readings from Last 3 Encounters:  01/19/23 153 lb 12.8 oz (69.8 kg)  09/11/22 145 lb 6.4 oz (66 kg)  09/09/22 140 lb (63.5 kg)      ASSESSMENT AND PLAN:  1  HTN  BP is a little high   Increase losart to 100  mg daily  Check BMET      2   CVA  CVA in OCt 2023  Appeared to be embolic by MRI  Montior negative  for arrhythmia   Will  have pt see Beckie Salts for  LINQ    Will get appt in neuro       3  HL   LDL 79   Work on Diet   Keep on Lipitor  4   Metabolics   123XX123 5.8  Cut back carbs/sweets  Cut out SSB  F/U in June                                              --------------------------ll review with EP LINQ   Pt should be seen in neuro    Will get appt   Will also dissuss with neuro TEE.  (Bubble study not the most sensitive)  3  Dizziness  Pt is very unsteady on feet   Needs PT/OT   Neuro follow up  4 Lipids    Keep  on atorvastatin     LDL 79   HDL 23  Trig157   Work on diet     5  Metabolics   123XX123 5.8   Admits to eating a lot of sweets, sweet tea.  Will check BMET  Current medicines are reviewed at length with the patient today.  The patient does not have concerns regarding medicines.  Signed, Dorris Carnes, MD  01/19/2023 9:38 AM    [address], [place], [place]  [address] Phone: [phone]; Fax: [phone]  ++

## 2023-01-19 ENCOUNTER — Other Ambulatory Visit (HOSPITAL_COMMUNITY)
Admission: RE | Admit: 2023-01-19 | Discharge: 2023-01-19 | Disposition: A | Discharge status: Home / Self Care | Payer: Medicare HMO | Source: Ambulatory Visit | Attending: Internal Medicine | Admitting: Internal Medicine

## 2023-01-19 ENCOUNTER — Ambulatory Visit: Payer: Medicare HMO | Attending: Internal Medicine | Admitting: Internal Medicine

## 2023-01-19 ENCOUNTER — Encounter: Payer: Self-pay | Admitting: Internal Medicine

## 2023-01-19 VITALS — BP 140/66 | HR 83 | Ht 66.0 in | Wt 153.8 lb

## 2023-01-19 DIAGNOSIS — I10 Essential (primary) hypertension: Secondary | ICD-10-CM | POA: Diagnosis present

## 2023-01-19 DIAGNOSIS — I639 Cerebral infarction, unspecified: Secondary | ICD-10-CM | POA: Diagnosis not present

## 2023-01-19 DIAGNOSIS — Z79899 Other long term (current) drug therapy: Secondary | ICD-10-CM | POA: Insufficient documentation

## 2023-01-19 LAB — BASIC METABOLIC PANEL
Anion gap: 7 (ref 5–15)
BUN: 18 mg/dL (ref 8–23)
CO2: 24 mmol/L (ref 22–32)
Calcium: 8.8 mg/dL — ABNORMAL LOW (ref 8.9–10.3)
Chloride: 107 mmol/L (ref 98–111)
Creatinine, Ser: 1.22 mg/dL (ref 0.61–1.24)
GFR, Estimated: >60 mL/min (ref >60)
Glucose, Bld: 111 mg/dL — ABNORMAL HIGH (ref 70–99)
Potassium: 4.2 mmol/L (ref 3.5–5.1)
Sodium: 138 mmol/L (ref 135–145)

## 2023-01-19 MED ORDER — LOSARTAN POTASSIUM 100 MG PO TABS: 100.0000 mg | ORAL_TABLET | Freq: Every day | ORAL | 3 refills | Status: AC

## 2023-01-19 NOTE — Patient Instructions (Signed)
Medication Instructions:  Your physician has recommended you make the following change in your medication:   Increase Losartan to 100 mg  1 Tablet Daily ( You may take 2 tablets of your 50 mg until supply is complete)   *If you need a refill on your cardiac medications before your next appointment, please call your pharmacy*   Lab Work: Your physician recommends that you return for lab work today.   If you have labs (blood work) drawn today and your tests are completely normal, you will receive your results only by: [place] (if you have MyChart) OR A paper copy in the mail If you have any lab test that is abnormal or we need to change your treatment, we will call you to review the results.   Testing/Procedures: NONE    Follow-Up: At [facility], you and your health needs are our priority.  As part of our continuing mission to provide you with exceptional heart care, we have created designated Provider Care Teams.  These Care Teams include your primary Cardiologist (physician) and Advanced Practice Providers (APPs -  Physician Assistants and Nurse Practitioners) who all work together to provide you with the care you need, when you need it.  We recommend signing up for the patient portal called "MyChart".  Sign up information is provided on this After Visit Summary.  MyChart is used to connect with patients for Virtual Visits (Telemedicine).  Patients are able to view lab/test results, encounter notes, upcoming appointments, etc.  Non-urgent messages can be sent to your provider as well.   To learn more about what you can do with MyChart, go to [email or web address].    Your next appointment:    June   Provider:   Dorris Carnes, MD    Other Instructions Thank you for choosing [facility]!

## 2023-01-20 ENCOUNTER — Telehealth: Payer: Self-pay | Admitting: *Deleted

## 2023-01-20 ENCOUNTER — Telehealth: Payer: Self-pay

## 2023-01-20 NOTE — Telephone Encounter (Signed)
-----   Message from Fay Records, MD sent at 01/19/2023  9:59 PM EST ----- Please set pt up to see Beckie Salts in [place] clinic for eval ofr possible LINQ  Hx of CVA

## 2023-01-20 NOTE — Telephone Encounter (Signed)
error 

## 2023-01-28 ENCOUNTER — Encounter: Payer: Self-pay | Admitting: Internal Medicine

## 2023-02-05 ENCOUNTER — Telehealth: Payer: Self-pay | Admitting: *Deleted

## 2023-02-05 DIAGNOSIS — I639 Cerebral infarction, unspecified: Secondary | ICD-10-CM

## 2023-02-05 NOTE — Telephone Encounter (Signed)
-----   Message from Paula Ross V, MD sent at 01/19/2023  9:59 PM EST ----- Please set pt up to see G Taylor in [place] clinic for eval ofr possible LINQ  Hx of CVA 

## 2023-02-11 ENCOUNTER — Encounter: Payer: Self-pay | Admitting: Orthopaedic Surgery

## 2023-02-11 ENCOUNTER — Encounter: Payer: Self-pay | Admitting: Internal Medicine

## 2023-02-13 ENCOUNTER — Emergency Department (HOSPITAL_COMMUNITY): Payer: Medicare HMO

## 2023-02-13 ENCOUNTER — Encounter (HOSPITAL_COMMUNITY): Payer: Self-pay

## 2023-02-13 ENCOUNTER — Emergency Department (HOSPITAL_COMMUNITY)
Admission: EM | Admit: 2023-02-13 | Discharge: 2023-02-13 | Disposition: A | Discharge status: Home / Self Care | Payer: Medicare HMO | Attending: Emergency Medicine | Admitting: Emergency Medicine

## 2023-02-13 DIAGNOSIS — R42 Dizziness and giddiness: Secondary | ICD-10-CM | POA: Diagnosis present

## 2023-02-13 DIAGNOSIS — R200 Anesthesia of skin: Secondary | ICD-10-CM | POA: Insufficient documentation

## 2023-02-13 DIAGNOSIS — I6782 Cerebral ischemia: Secondary | ICD-10-CM | POA: Diagnosis not present

## 2023-02-13 DIAGNOSIS — Z7982 Long term (current) use of aspirin: Secondary | ICD-10-CM | POA: Diagnosis not present

## 2023-02-13 DIAGNOSIS — Z8673 Personal history of transient ischemic attack (TIA), and cerebral infarction without residual deficits: Secondary | ICD-10-CM | POA: Diagnosis not present

## 2023-02-13 DIAGNOSIS — R7989 Other specified abnormal findings of blood chemistry: Secondary | ICD-10-CM | POA: Diagnosis not present

## 2023-02-13 HISTORY — DX: Cerebral infarction, unspecified: I63.9

## 2023-02-13 LAB — CBC
HCT: 45.6 % (ref 39.0–52.0)
Hemoglobin: 15.2 g/dL (ref 13.0–17.0)
MCH: 30.6 pg (ref 26.0–34.0)
MCHC: 33.3 g/dL (ref 30.0–36.0)
MCV: 91.8 fL (ref 80.0–100.0)
Platelets: 192 10*3/uL (ref 150–400)
RBC: 4.97 MIL/uL (ref 4.22–5.81)
RDW: 14.5 % (ref 11.5–15.5)
WBC: 10.1 10*3/uL (ref 4.0–10.5)
nRBC: 0 % (ref 0.0–0.2)

## 2023-02-13 LAB — DIFFERENTIAL
Abs Immature Granulocytes: 0.03 10*3/uL (ref 0.00–0.07)
Basophils Absolute: 0.1 10*3/uL (ref 0.0–0.1)
Basophils Relative: 1 %
Eosinophils Absolute: 0.4 10*3/uL (ref 0.0–0.5)
Eosinophils Relative: 4 %
Immature Granulocytes: 0 %
Lymphocytes Relative: 38 %
Lymphs Abs: 3.8 10*3/uL (ref 0.7–4.0)
Monocytes Absolute: 0.8 10*3/uL (ref 0.1–1.0)
Monocytes Relative: 8 %
Neutro Abs: 5 10*3/uL (ref 1.7–7.7)
Neutrophils Relative %: 49 %

## 2023-02-13 LAB — COMPREHENSIVE METABOLIC PANEL
ALT: 70 U/L — ABNORMAL HIGH (ref 0–44)
AST: 56 U/L — ABNORMAL HIGH (ref 15–41)
Albumin: 4 g/dL (ref 3.5–5.0)
Alkaline Phosphatase: 122 U/L (ref 38–126)
Anion gap: 7 (ref 5–15)
BUN: 18 mg/dL (ref 8–23)
CO2: 21 mmol/L — ABNORMAL LOW (ref 22–32)
Calcium: 8.7 mg/dL — ABNORMAL LOW (ref 8.9–10.3)
Chloride: 107 mmol/L (ref 98–111)
Creatinine, Ser: 1.03 mg/dL (ref 0.61–1.24)
GFR, Estimated: >60 mL/min (ref >60)
Glucose, Bld: 102 mg/dL — ABNORMAL HIGH (ref 70–99)
Potassium: 4.3 mmol/L (ref 3.5–5.1)
Sodium: 135 mmol/L (ref 135–145)
Total Bilirubin: 0.4 mg/dL (ref 0.3–1.2)
Total Protein: 8.3 g/dL — ABNORMAL HIGH (ref 6.5–8.1)

## 2023-02-13 LAB — PROTIME-INR
INR: 1.1 (ref 0.8–1.2)
Prothrombin Time: 14.5 seconds (ref 11.4–15.2)

## 2023-02-13 LAB — APTT: aPTT: 29 seconds (ref 24–36)

## 2023-02-13 LAB — ETHANOL: Alcohol, Ethyl (B): <10 mg/dL (ref <10)

## 2023-02-13 NOTE — Discharge Instructions (Signed)
You were seen in the emergency department for dizziness.  You had blood work EKG CAT scan that did not show an obvious explanation for your symptoms.  Please continue your current medications and follow-up with your primary care doctor.  Return to the emergency department if any worsening or concerning symptoms

## 2023-02-13 NOTE — ED Notes (Signed)
Pt ambulated approximately 100' with no visible or reported distress. Without assistance. SpO2 remained at 98% on RA.

## 2023-02-13 NOTE — ED Provider Notes (Signed)
[facility] Provider Note   CSN: [id number] Arrival date & time: 02/13/23  1248     History {Add pertinent medical, surgical, social history, OB history to HPI:1} Chief Complaint  Patient presents with   Dizziness    Randy Ashley is a 67 y.o. male.  He is here with a complaint of intermittent dizziness for the last 3 days.  He said it comes and goes does not seem to be related to position.  He also feels hot on the left side of his head.  Sometimes sees spots in his vision. He denies any double vision nausea vomiting diarrhea constipation urinary symptoms.  No fevers chills chest pain shortness of breath.  No change in his medications.  Michela Pitcher he had a prior stroke back in the fall that was associated with some dizziness but also had some left-sided weakness and numbness.  He said he continues to have some numbness on his left side and goes to therapy for it, no changes.  The history is provided by the patient.  Dizziness Quality:  Lightheadedness Severity:  Moderate Onset quality:  Gradual Duration:  3 days Timing:  Intermittent Progression:  Unchanged Chronicity:  New Relieved by:  None tried Worsened by:  Nothing Ineffective treatments:  None tried Associated symptoms: vision changes   Associated symptoms: no blood in stool, no chest pain, no diarrhea, no headaches, no nausea, no shortness of breath, no syncope, no vomiting and no weakness   Risk factors: hx of stroke        Home Medications Prior to Admission medications   Medication Sig Start Date End Date Taking? Authorizing Provider  amLODipine (NORVASC) 10 MG tablet Take 1 tablet (10 mg total) by mouth daily. 09/08/22   Murlean Iba, MD  aspirin 81 MG chewable tablet Chew 1 tablet (81 mg total) by mouth daily. 09/08/22   Johnson, Clanford L, MD  atorvastatin (LIPITOR) 40 MG tablet Take 1 tablet (40 mg total) by mouth every evening. 09/08/22   Johnson, Clanford L, MD   folic acid (FOLVITE) 1 MG tablet Take 1 tablet (1 mg total) by mouth daily. 09/09/22   Johnson, Clanford L, MD  lansoprazole (PREVACID) 30 MG capsule Take 1 capsule (30 mg total) by mouth daily. 09/08/22 09/08/23  Johnson, Clanford L, MD  losartan (COZAAR) 100 MG tablet Take 1 tablet (100 mg total) by mouth daily. 01/19/23 01/14/24  Fay Records, MD  meclizine (ANTIVERT) 25 MG tablet Take 25 mg by mouth every 8 (eight) hours as needed for dizziness. 09/02/22   [provider]  Multiple Vitamin (MULTIVITAMIN WITH MINERALS) TABS tablet Take 1 tablet by mouth daily. 09/09/22   Johnson, Clanford L, MD  thiamine (VITAMIN B-1) 100 MG tablet Take 1 tablet (100 mg total) by mouth daily. 09/09/22   Murlean Iba, MD      Allergies    Patient has no known allergies.    Review of Systems   Review of Systems  Constitutional:  Negative for fever.  HENT:  Negative for sore throat.   Eyes:  Positive for visual disturbance.  Respiratory:  Negative for shortness of breath.   Cardiovascular:  Negative for chest pain and syncope.  Gastrointestinal:  Negative for abdominal pain, blood in stool, diarrhea, nausea and vomiting.  Genitourinary:  Negative for dysuria.  Skin:  Negative for rash.  Neurological:  Positive for dizziness. Negative for weakness and headaches.    Physical Exam Updated Vital Signs BP  138/73 (BP Location: Right Arm)   Pulse 63   Temp 98.1 F (36.7 C) (Oral)   Resp 18   Ht '5\' 7"'$  (1.702 m)   Wt 63.5 kg   SpO2 99%   BMI 21.93 kg/m  Physical Exam Vitals and nursing note reviewed.  Constitutional:      General: He is not in acute distress.    Appearance: Normal appearance. He is well-developed.  HENT:     Head: Normocephalic and atraumatic.  Eyes:     Conjunctiva/sclera: Conjunctivae normal.  Cardiovascular:     Rate and Rhythm: Normal rate and regular rhythm.     Heart sounds: No murmur heard. Pulmonary:     Effort: Pulmonary effort is normal. No respiratory  distress.     Breath sounds: Normal breath sounds.  Abdominal:     Palpations: Abdomen is soft.     Tenderness: There is no abdominal tenderness.  Musculoskeletal:        General: No swelling.     Cervical back: Neck supple.  Skin:    General: Skin is warm and dry.     Capillary Refill: Capillary refill takes less than 2 seconds.  Neurological:     General: No focal deficit present.     Mental Status: He is alert and oriented to person, place, and time.     Cranial Nerves: No cranial nerve deficit.     Motor: No weakness.     Gait: Gait normal.     ED Results / Procedures / Treatments   Labs (all labs ordered are listed, but only abnormal results are displayed) Labs Reviewed  COMPREHENSIVE METABOLIC PANEL - Abnormal; Notable for the following components:      Result Value   CO2 21 (*)    Glucose, Bld 102 (*)    Calcium 8.7 (*)    Total Protein 8.3 (*)    AST 56 (*)    ALT 70 (*)    All other components within normal limits  PROTIME-INR  APTT  CBC  DIFFERENTIAL  ETHANOL  RAPID URINE DRUG SCREEN, HOSP PERFORMED  URINALYSIS, ROUTINE W REFLEX MICROSCOPIC    EKG None  Radiology CT HEAD WO CONTRAST  Result Date: 02/13/2023 CLINICAL DATA:  Syncope/presyncope, cerebrovascular cause suspected. Dizziness, left-sided head warmth, and visual changes. History of stroke. EXAM: CT HEAD WITHOUT CONTRAST TECHNIQUE: Contiguous axial images were obtained from the base of the skull through the vertex without intravenous contrast. RADIATION DOSE REDUCTION: This exam was performed according to the departmental dose-optimization program which includes automated exposure control, adjustment of the mA and/or kV according to patient size and/or use of iterative reconstruction technique. COMPARISON:  Head and neck CTA 09/09/2022.  Head MRI 09/08/2022. FINDINGS: Brain: There is no evidence of an acute infarct, intracranial hemorrhage, mass, midline shift, or extra-axial fluid collection. A chronic  infarct is again noted in the right corona radiata. Chronic lacunar infarcts are also present in the thalamus and dorsal right pons (acute on the prior MRI). Hypodensities elsewhere in the cerebral white matter bilaterally are unchanged and nonspecific but compatible with mild chronic small vessel ischemic disease. The ventricles and sulci are within normal limits for age. Vascular: Calcified atherosclerosis at the skull base. No hyperdense vessel. Skull: No acute fracture or suspicious osseous lesion. Sinuses/Orbits: Visualized paranasal sinuses and mastoid air cells are clear. Unremarkable orbits. 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 IMPRESSION: 1. No evidence of  acute intracranial abnormality. 2. Mild chronic small vessel ischemic disease with chronic infarcts as above. Electronically Signed   By: Logan Bores M.D.   On: 02/13/2023 14:58    Procedures Procedures  {Document cardiac monitor, telemetry assessment procedure when appropriate:1}  Medications Ordered in ED Medications - No data to display  ED Course/ Medical Decision Making/ A&P   {   Click here for ABCD2, HEART and other calculatorsREFRESH Note before signing :1}                          Medical Decision Making  This patient complains of ***; this involves an extensive number of treatment Options and is a complaint that carries with it a high risk of complications and morbidity. The differential includes ***  I ordered, reviewed and interpreted labs, which included *** I ordered medication *** and reviewed PMP when indicated. I ordered imaging studies which included *** and I independently    visualized and interpreted imaging which showed *** Additional history obtained from *** Previous records obtained and reviewed *** I consulted *** and discussed lab and imaging findings and discussed disposition.  Cardiac monitoring reviewed,  *** Social determinants considered, *** Critical Interventions: ***  After the interventions stated above, I reevaluated the patient and found *** Admission and further testing considered, ***   {Document critical care time when appropriate:1} {Document review of labs and clinical decision tools ie heart score, Chads2Vasc2 etc:1}  {Document your independent review of radiology images, and any outside records:1} {Document your discussion with family members, caretakers, and with consultants:1} {Document social determinants of health affecting pt's care:1} {Document your decision making why or why not admission, treatments were needed:1} Final Clinical Impression(s) / ED Diagnoses Final diagnoses:  None    Rx / DC Orders ED Discharge Orders     None

## 2023-02-13 NOTE — ED Triage Notes (Signed)
Pt reports for the last two days having increasing dizziness and L side of his head "feels hot". Denies HA. Vision changes have been "seeing spots". Pt recently had a stroke in September with L sided residual deficits. No new weakness per pt.

## 2023-02-18 ENCOUNTER — Other Ambulatory Visit (HOSPITAL_COMMUNITY): Payer: Self-pay | Admitting: Family Medicine

## 2023-02-18 DIAGNOSIS — Z122 Encounter for screening for malignant neoplasm of respiratory organs: Secondary | ICD-10-CM

## 2023-03-10 NOTE — Progress Notes (Deleted)
Referring Provider: Lemmie Evens, MD Primary Care Physician:  Lemmie Evens, MD Primary Gastroenterologist:  Dr. Rayne Du chief complaint on file.   HPI:   Randy Ashley is a 67 y.o. male presenting today at the request of Lemmie Evens, MD for abdominal pain and abdominal distention.   History of acute CVA September 2023, HTN, elevated LFTs,  Past Medical History:  Diagnosis Date   Chronic back pain    CVA (cerebral vascular accident) [facility])    September 2023   Hypertension    Lumbar radiculopathy     Past Surgical History:  Procedure Laterality Date   HERNIA REPAIR      Current Outpatient Medications  Medication Sig Dispense Refill   amLODipine (NORVASC) 10 MG tablet Take 1 tablet (10 mg total) by mouth daily. 30 tablet 1   aspirin 81 MG chewable tablet Chew 1 tablet (81 mg total) by mouth daily. 30 tablet 3   atorvastatin (LIPITOR) 40 MG tablet Take 1 tablet (40 mg total) by mouth every evening. 30 tablet 1   folic acid (FOLVITE) 1 MG tablet Take 1 tablet (1 mg total) by mouth daily. 30 tablet 1   lansoprazole (PREVACID) 30 MG capsule Take 1 capsule (30 mg total) by mouth daily. 30 capsule 1   losartan (COZAAR) 100 MG tablet Take 1 tablet (100 mg total) by mouth daily. 90 tablet 3   meclizine (ANTIVERT) 25 MG tablet Take 25 mg by mouth every 8 (eight) hours as needed for dizziness.     Multiple Vitamin (MULTIVITAMIN WITH MINERALS) TABS tablet Take 1 tablet by mouth daily. 30 tablet 1   thiamine (VITAMIN B-1) 100 MG tablet Take 1 tablet (100 mg total) by mouth daily. 30 tablet 1   No current facility-administered medications for this visit.    Allergies as of 03/12/2023   (No Known Allergies)    Family History  Problem Relation Age of Onset   Diabetes Other        Family History    Heart disease Other        Family History    Arthritis Other        Family History     Social History   Socioeconomic History   Marital status: Divorced    Spouse  name: Not on file   Number of children: Not on file   Years of education: 12   Highest education level: Not on file  Occupational History   Occupation: dye and Games developer   Tobacco Use   Smoking status: Every Day    Packs/day: 1    Types: Cigarettes   Smokeless tobacco: Never  Vaping Use   Vaping Use: Never used  Substance and Sexual Activity   Alcohol use: Not Currently    Comment: Drinks 6 pack of beer per day    Drug use: No   Sexual activity: Not on file  Other Topics Concern   Not on file  Social History Narrative   Not on file   Social Determinants of Health   Financial Resource Strain: Not on file  Food Insecurity: No Food Insecurity (09/06/2022)   Hunger Vital Sign    Worried About Running Out of Food in the Last Year: Never true    Ran Out of Food in the Last Year: Never true  Transportation Needs: No Transportation Needs (09/06/2022)   PRAPARE - Hydrologist (Medical): No    Lack of Transportation (Non-Medical): No  Physical  Activity: Not on file  Stress: Not on file  Social Connections: Not on file  Intimate Partner Violence: Not At Risk (09/06/2022)   Humiliation, Afraid, Rape, and Kick questionnaire    Fear of Current or Ex-Partner: No    Emotionally Abused: No    Physically Abused: No    Sexually Abused: No    Review of Systems: Gen: Denies any fever, chills, fatigue, weight loss, lack of appetite.  CV: Denies chest pain, heart palpitations, peripheral edema, syncope.  Resp: Denies shortness of breath at rest or with exertion. Denies wheezing or cough.  GI: Denies dysphagia or odynophagia. Denies jaundice, hematemesis, fecal incontinence. GU : Denies urinary burning, urinary frequency, urinary hesitancy MS: Denies joint pain, muscle weakness, cramps, or limitation of movement.  Derm: Denies rash, itching, dry skin Psych: Denies depression, anxiety, memory loss, and confusion Heme: Denies bruising, bleeding, and  enlarged lymph nodes.  Physical Exam: There were no vitals taken for this visit. General:   Alert and oriented. Pleasant and cooperative. Well-nourished and well-developed.  Head:  Normocephalic and atraumatic. Eyes:  Without icterus, sclera clear and conjunctiva pink.  Ears:  Normal auditory acuity. Lungs:  Clear to auscultation bilaterally. No wheezes, rales, or rhonchi. No distress.  Heart:  S1, S2 present without murmurs appreciated.  Abdomen:  +BS, soft, non-tender and non-distended. No HSM noted. No guarding or rebound. No masses appreciated.  Rectal:  Deferred  Msk:  Symmetrical without gross deformities. Normal posture. Extremities:  Without edema. Neurologic:  Alert and  oriented x4;  grossly normal neurologically. Skin:  Intact without significant lesions or rashes. Psych:  Alert and cooperative. Normal mood and affect.    Assessment:     Plan:  ***   Aliene Altes, PA-C [facility]enter[facility] 03/12/2023

## 2023-03-12 ENCOUNTER — Ambulatory Visit: Payer: Medicare HMO | Admitting: Gastroenterology

## 2023-03-12 ENCOUNTER — Encounter: Payer: Self-pay | Admitting: Gastroenterology

## 2023-05-01 ENCOUNTER — Other Ambulatory Visit: Payer: Self-pay

## 2023-05-01 ENCOUNTER — Emergency Department (HOSPITAL_COMMUNITY)
Admission: EM | Admit: 2023-05-01 | Discharge: 2023-05-01 | Disposition: A | Discharge status: Home / Self Care | Payer: Medicare HMO | Attending: Emergency Medicine | Admitting: Emergency Medicine

## 2023-05-01 ENCOUNTER — Encounter (HOSPITAL_COMMUNITY): Payer: Self-pay | Admitting: *Deleted

## 2023-05-01 ENCOUNTER — Emergency Department (HOSPITAL_COMMUNITY): Payer: Medicare HMO

## 2023-05-01 DIAGNOSIS — Z7982 Long term (current) use of aspirin: Secondary | ICD-10-CM | POA: Insufficient documentation

## 2023-05-01 DIAGNOSIS — R200 Anesthesia of skin: Secondary | ICD-10-CM | POA: Diagnosis present

## 2023-05-01 LAB — CBC WITH DIFFERENTIAL/PLATELET
Abs Immature Granulocytes: 0.02 10*3/uL (ref 0.00–0.07)
Basophils Absolute: 0.1 10*3/uL (ref 0.0–0.1)
Basophils Relative: 1 %
Eosinophils Absolute: 0.4 10*3/uL (ref 0.0–0.5)
Eosinophils Relative: 5 %
HCT: 45.8 % (ref 39.0–52.0)
Hemoglobin: 15.3 g/dL (ref 13.0–17.0)
Immature Granulocytes: 0 %
Lymphocytes Relative: 39 %
Lymphs Abs: 3.1 10*3/uL (ref 0.7–4.0)
MCH: 30.3 pg (ref 26.0–34.0)
MCHC: 33.4 g/dL (ref 30.0–36.0)
MCV: 90.7 fL (ref 80.0–100.0)
Monocytes Absolute: 0.7 10*3/uL (ref 0.1–1.0)
Monocytes Relative: 9 %
Neutro Abs: 3.7 10*3/uL (ref 1.7–7.7)
Neutrophils Relative %: 46 %
Platelets: 189 10*3/uL (ref 150–400)
RBC: 5.05 MIL/uL (ref 4.22–5.81)
RDW: 14.2 % (ref 11.5–15.5)
WBC: 8 10*3/uL (ref 4.0–10.5)
nRBC: 0 % (ref 0.0–0.2)

## 2023-05-01 LAB — BASIC METABOLIC PANEL
Anion gap: 9 (ref 5–15)
BUN: 23 mg/dL (ref 8–23)
CO2: 23 mmol/L (ref 22–32)
Calcium: 9 mg/dL (ref 8.9–10.3)
Chloride: 108 mmol/L (ref 98–111)
Creatinine, Ser: 1.31 mg/dL — ABNORMAL HIGH (ref 0.61–1.24)
GFR, Estimated: >60 mL/min (ref >60)
Glucose, Bld: 105 mg/dL — ABNORMAL HIGH (ref 70–99)
Potassium: 4 mmol/L (ref 3.5–5.1)
Sodium: 140 mmol/L (ref 135–145)

## 2023-05-01 NOTE — ED Triage Notes (Signed)
Pt c/o constant left sided feeling of tightness and numbness from left shoulder down to left foot that has been going on since his he had a stroke in September of 2023.

## 2023-05-01 NOTE — Discharge Instructions (Signed)
You were seen in the emergency department for increased stiffness and numbness on your left side.  You had a CAT scan of your head that was unchanged.  Your lab work was fairly unremarkable although you look a little dehydrated.  Please drink plenty of fluids and follow-up with your primary care doctor.  Continue your regular medications.  Return to the emergency department if any worsening or concerning symptoms.

## 2023-05-01 NOTE — ED Provider Notes (Signed)
[facility] EMERGENCY DEPARTMENT AT [facility] Provider Note   CSN: [id number] Arrival date & time: 05/01/23  0957     History  Chief Complaint  Patient presents with   Numbness    Randy Ashley is a 67 y.o. male.  He has a prior history of stroke which is left him with some left-sided deficits.  He said he has done therapy and he is about 75% better.  Today he noticed some tightness in his left arm and leg and a little bit more numbness in his left thigh than he usually gets.  He says this has happened from time to time.  He just wanted to get it checked out.  He has tried nothing for it.  He denies any headache blurry vision fevers chills nausea vomiting.  The history is provided by the patient.  Cerebrovascular Accident This is a recurrent problem. The problem occurs rarely. The problem has been gradually improving. Pertinent negatives include no chest pain, no abdominal pain, no headaches and no shortness of breath. Nothing aggravates the symptoms. Nothing relieves the symptoms. He has tried rest for the symptoms. The treatment provided moderate relief.       Home Medications Prior to Admission medications   Medication Sig Start Date End Date Taking? Authorizing Provider  amLODipine (NORVASC) 10 MG tablet Take 1 tablet (10 mg total) by mouth daily. 09/08/22   Cleora Fleet, MD  aspirin 81 MG chewable tablet Chew 1 tablet (81 mg total) by mouth daily. 09/08/22   Johnson, Clanford L, MD  atorvastatin (LIPITOR) 40 MG tablet Take 1 tablet (40 mg total) by mouth every evening. 09/08/22   Johnson, Clanford L, MD  folic acid (FOLVITE) 1 MG tablet Take 1 tablet (1 mg total) by mouth daily. 09/09/22   Johnson, Clanford L, MD  lansoprazole (PREVACID) 30 MG capsule Take 1 capsule (30 mg total) by mouth daily. 09/08/22 09/08/23  Johnson, Clanford L, MD  losartan (COZAAR) 100 MG tablet Take 1 tablet (100 mg total) by mouth daily. 01/19/23 01/14/24  Pricilla Riffle, MD  meclizine  (ANTIVERT) 25 MG tablet Take 25 mg by mouth every 8 (eight) hours as needed for dizziness. 09/02/22   [provider]  Multiple Vitamin (MULTIVITAMIN WITH MINERALS) TABS tablet Take 1 tablet by mouth daily. 09/09/22   Johnson, Clanford L, MD  thiamine (VITAMIN B-1) 100 MG tablet Take 1 tablet (100 mg total) by mouth daily. 09/09/22   Cleora Fleet, MD      Allergies    Patient has no known allergies.    Review of Systems   Review of Systems  Constitutional:  Negative for fever.  Eyes:  Negative for visual disturbance.  Respiratory:  Negative for shortness of breath.   Cardiovascular:  Negative for chest pain.  Gastrointestinal:  Negative for abdominal pain.  Neurological:  Positive for numbness. Negative for speech difficulty, weakness and headaches.    Physical Exam Updated Vital Signs BP 124/60 (BP Location: Right Arm)   Pulse (!) 50   Temp 98.1 F (36.7 C) (Oral)   Resp 18   Ht 5\' 7"  (1.702 m)   Wt 63.5 kg   SpO2 99%   BMI 21.93 kg/m  Physical Exam Vitals and nursing note reviewed.  Constitutional:      General: He is not in acute distress.    Appearance: Normal appearance. He is well-developed.  HENT:     Head: Normocephalic and atraumatic.  Eyes:  Conjunctiva/sclera: Conjunctivae normal.  Cardiovascular:     Rate and Rhythm: Normal rate and regular rhythm.     Heart sounds: No murmur heard. Pulmonary:     Effort: Pulmonary effort is normal. No respiratory distress.     Breath sounds: Normal breath sounds.  Abdominal:     Palpations: Abdomen is soft.     Tenderness: There is no abdominal tenderness.  Musculoskeletal:        General: No tenderness or deformity. Normal range of motion.     Cervical back: Neck supple.  Skin:    General: Skin is warm and dry.     Capillary Refill: Capillary refill takes less than 2 seconds.  Neurological:     General: No focal deficit present.     Mental Status: He is alert and oriented to person, place, and  time.     Cranial Nerves: No cranial nerve deficit.     Sensory: No sensory deficit.     Motor: No weakness.     Gait: Gait normal.     ED Results / Procedures / Treatments   Labs (all labs ordered are listed, but only abnormal results are displayed) Labs Reviewed  BASIC METABOLIC PANEL - Abnormal; Notable for the following components:      Result Value   Glucose, Bld 105 (*)    Creatinine, Ser 1.31 (*)    All other components within normal limits  CBC WITH DIFFERENTIAL/PLATELET    EKG None  Radiology CT Head Wo Contrast  Result Date: 05/01/2023 CLINICAL DATA:  67 year old male acute neurologic deficit. Feeling of left side tightness and numbness from the shoulder to the foot. History of prior stroke in September 2023. EXAM: CT HEAD WITHOUT CONTRAST TECHNIQUE: Contiguous axial images were obtained from the base of the skull through the vertex without intravenous contrast. RADIATION DOSE REDUCTION: This exam was performed according to the departmental dose-optimization program which includes automated exposure control, adjustment of the mA and/or kV according to patient size and/or use of iterative reconstruction technique. COMPARISON:  Brain MRI 09/08/2022.  Head CT 02/13/2023. FINDINGS: Brain: Chronic lacunar infarcts redemonstrated in the right basal ganglia, dorsal right brainstem, left thalamus. Stable cerebral volume since last year. No midline shift, ventriculomegaly, mass effect, evidence of mass lesion, intracranial hemorrhage or evidence of cortically based acute infarction. Stable gray-white matter differentiation throughout the brain. Vascular: No suspicious intracranial vascular hyperdensity. Calcified atherosclerosis at the skull base. Skull: No acute osseous abnormality identified. Sinuses/Orbits: Visualized paranasal sinuses and mastoids are stable and well aerated. Other: Stable orbit and scalp soft tissues. 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 IMPRESSION: 1. No acute intracranial abnormality. 2. Stable non contrast CT appearance of chronic small vessel disease. Electronically Signed   By: Odessa Fleming M.D.   On: 05/01/2023 12:10    Procedures Procedures    Medications Ordered in ED Medications - No data to display  ED Course/ Medical Decision Making/ A&P Clinical Course as of 05/01/23 1804  Fri May 01, 2023  1427 Patient's workup has been fairly unremarkable other than mild signs of dehydration.  Reviewed this with him and he is comfortable plan for discharge.  Recommend hydration at home and follow-up with his PCP.  Return instructions discussed [MB]    Clinical Course User Index [MB] Terrilee Files, MD                             Medical Decision  Making Amount and/or Complexity of Data Reviewed Labs: ordered. Radiology: ordered.   This patient complains of left arm and leg tightness, left thigh numbness; this involves an extensive number of treatment Options and is a complaint that carries with it a high risk of complications and morbidity. The differential includes stroke, bleed, musculoskeletal pain, metabolic derangement, dehydration  I ordered, reviewed and interpreted labs, which included CBC normal chemistries normal other than mild AKI I ordered imaging studies which included head CT and I independently    visualized and interpreted imaging which showed no acute findings Previous records obtained and reviewed in epic, patient seen in March for dizziness Cardiac monitoring reviewed, normal sinus rhythm Social determinants considered, ongoing tobacco use Critical Interventions: None  After the interventions stated above, I reevaluated the patient and found patient to be neurologically intact in no distress Admission and further testing considered, reviewed results with patient and he is comfortable plan for discharge.  He will continue to hydrate at home and follow-up with his PCP.  Return instructions  discussed.         Final Clinical Impression(s) / ED Diagnoses Final diagnoses:  Numbness of left anterior thigh    Rx / DC Orders ED Discharge Orders     None         Terrilee Files, MD 05/01/23 1806

## 2023-05-10 ENCOUNTER — Emergency Department (HOSPITAL_COMMUNITY)
Admission: EM | Admit: 2023-05-10 | Discharge: 2023-05-10 | Disposition: A | Discharge status: Home / Self Care | Payer: Medicare HMO | Attending: Emergency Medicine | Admitting: Emergency Medicine

## 2023-05-10 ENCOUNTER — Encounter (HOSPITAL_COMMUNITY): Payer: Self-pay | Admitting: Emergency Medicine

## 2023-05-10 ENCOUNTER — Other Ambulatory Visit: Payer: Self-pay

## 2023-05-10 DIAGNOSIS — I6930 Unspecified sequelae of cerebral infarction: Secondary | ICD-10-CM

## 2023-05-10 DIAGNOSIS — M2560 Stiffness of unspecified joint, not elsewhere classified: Secondary | ICD-10-CM | POA: Insufficient documentation

## 2023-05-10 NOTE — ED Provider Notes (Signed)
[facility] EMERGENCY DEPARTMENT AT [facility]  Provider Note  CSN: [id number] Arrival date & time: 05/10/23 0501  History Chief Complaint  Patient presents with   Stiffness    Randy Ashley is a 67 y.o. 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 He completed physical therapy and regained about 75% of his function reports he still feels stiff on the left side and he wonders if there is anything we can do about it. He was seen in the ED for same thing on 5/24 and had a negative workup including labs and CT head. He has not followed up with his PCP.    Home Medications Prior to Admission medications   Medication Sig Start Date End Date Taking? Authorizing Provider  amLODipine (NORVASC) 10 MG tablet Take 1 tablet (10 mg total) by mouth daily. 09/08/22   Cleora Fleet, MD  aspirin 81 MG chewable tablet Chew 1 tablet (81 mg total) by mouth daily. 09/08/22   Johnson, Clanford L, MD  atorvastatin (LIPITOR) 40 MG tablet Take 1 tablet (40 mg total) by mouth every evening. 09/08/22   Johnson, Clanford L, MD  folic acid (FOLVITE) 1 MG tablet Take 1 tablet (1 mg total) by mouth daily. 09/09/22   Johnson, Clanford L, MD  lansoprazole (PREVACID) 30 MG capsule Take 1 capsule (30 mg total) by mouth daily. 09/08/22 09/08/23  Johnson, Clanford L, MD  losartan (COZAAR) 100 MG tablet Take 1 tablet (100 mg total) by mouth daily. 01/19/23 01/14/24  Pricilla Riffle, MD  meclizine (ANTIVERT) 25 MG tablet Take 25 mg by mouth every 8 (eight) hours as needed for dizziness. 09/02/22   [provider]  Multiple Vitamin (MULTIVITAMIN WITH MINERALS) TABS tablet Take 1 tablet by mouth daily. 09/09/22   Johnson, Clanford L, MD  thiamine (VITAMIN B-1) 100 MG tablet Take 1 tablet (100 mg total) by mouth daily. 09/09/22   Cleora Fleet, MD     Allergies    Patient has no known allergies.   Review of Systems   Review of Systems Please see HPI for pertinent positives and  negatives  Physical Exam BP (!) 146/76 (BP Location: Left Arm)   Pulse 73 Comment: Simultaneous filing. User may not have seen previous data.  Temp (!) 97.5 F (36.4 C) (Oral)   Resp 18   Ht 5\' 7"  (1.702 m)   Wt 64 kg   SpO2 100% Comment: Simultaneous filing. User may not have seen previous data.  BMI 22.10 kg/m   Physical Exam Vitals and nursing note reviewed.  Constitutional:      Appearance: Normal appearance.  HENT:     Head: Normocephalic and atraumatic.     Nose: Nose normal.     Mouth/Throat:     Mouth: Mucous membranes are moist.  Eyes:     Extraocular Movements: Extraocular movements intact.     Conjunctiva/sclera: Conjunctivae normal.  Cardiovascular:     Rate and Rhythm: Normal rate.  Pulmonary:     Effort: Pulmonary effort is normal.     Breath sounds: Normal breath sounds.  Abdominal:     General: Abdomen is flat.     Palpations: Abdomen is soft.     Tenderness: There is no abdominal tenderness.  Musculoskeletal:        General: No swelling. Normal range of motion.     Cervical back: Neck supple.  Skin:    General: Skin is warm and dry.  Neurological:  General: No focal deficit present.     Mental Status: He is alert and oriented to person, place, and time.     Cranial Nerves: No cranial nerve deficit.     Sensory: No sensory deficit.     Motor: No weakness.     Gait: Gait normal.  Psychiatric:        Mood and Affect: Mood normal.     ED Results / Procedures / Treatments   EKG None  Procedures Procedures  Medications Ordered in the ED Medications - No data to display  Initial Impression and Plan  Patient here for re-evaluation of post-stroke stiffness. He has no focal deficits on exam tonight to suggest a new event. He was seen for same just a week ago and had a negative workup then. I explained to the patient that this is likely going to be a long term problem related to his prior stroke and that he may never feel 100% back to normal. He  was encouraged to follow up with his PCP for ongoing management including consideration for further PT and/or prescription medications such as Baclofen to help with his stiffness. He has no acute concern tonight and no emergent condition is identified. PCP follow up, RTED for any other concerns.    ED Course       MDM Rules/Calculators/A&P Medical Decision Making Problems Addressed: Sequelae, post-stroke: chronic illness or injury     Final Clinical Impression(s) / ED Diagnoses Final diagnoses:  Sequelae, post-stroke    Rx / DC Orders ED Discharge Orders     None        Pollyann Savoy, MD 05/10/23 [phone]

## 2023-05-10 NOTE — ED Triage Notes (Signed)
Pt with c/o L sided stiffness in leg, side, and arm that has been ongoing "for months". Pt states that he came tonight because "he is tired of it".

## 2023-05-14 ENCOUNTER — Ambulatory Visit
Admission: EM | Admit: 2023-05-14 | Discharge: 2023-05-14 | Disposition: A | Discharge status: Home / Self Care | Payer: Medicare HMO | Attending: Nurse Practitioner | Admitting: Nurse Practitioner

## 2023-05-14 ENCOUNTER — Encounter: Payer: Self-pay | Admitting: Emergency Medicine

## 2023-05-14 ENCOUNTER — Emergency Department (HOSPITAL_COMMUNITY)
Admission: EM | Admit: 2023-05-14 | Discharge: 2023-05-14 | Discharge status: Against Medical Advice | Payer: Medicare HMO

## 2023-05-14 ENCOUNTER — Other Ambulatory Visit: Payer: Self-pay

## 2023-05-14 DIAGNOSIS — R0789 Other chest pain: Secondary | ICD-10-CM

## 2023-05-14 NOTE — ED Notes (Signed)
Patient is being discharged from the Urgent Care and sent to the Emergency Department via POV . Per Glo Herring.  Patient is in need of higher level of care due to chest tightness. Patient is aware and verbalizes understanding of plan of care.  Vitals:   05/14/23 0911  BP: 131/78  Pulse: (!) 58  Resp: 20  Temp: 98 F (36.7 C)  SpO2: 96%

## 2023-05-14 NOTE — Discharge Instructions (Addendum)
Please go directly to the ER for further evaluation and management of your chest tightness.

## 2023-05-14 NOTE — ED Notes (Signed)
Patient called, not in lobby 

## 2023-05-14 NOTE — ED Triage Notes (Signed)
Pt reports left side chest "tightness" that radiates to left arm. Denies shortness of breath, dizziness. Pt reports has left sided numbness at baseline since stroke in Dec 2023.   Pt alert, speech clear, gait steady. Non-diaphoretic.

## 2023-05-14 NOTE — ED Provider Notes (Signed)
[facility]    CSN: [id number] Arrival date & time: 05/14/23  0858      History   Chief Complaint Chief Complaint  Patient presents with   Chest Pain    HPI Randy Ashley is a 67 y.o. male.   Patient presents today with chest tightness that began 2 hours after waking up.  He also endorses left arm and left leg tightness; has history of stroke 9 months ago and associated chronic left arm and left leg numbness.  Denies recent fall, trauma, injury to his chest or left arm/leg.  Denies shortness of breath, recent increase in physical activity, recent life stressors.  Reports he was on his way to work when symptoms started.  No diaphoresis or dizziness.  Has not taken anything for symptoms prior to arrival today.    Past Medical History:  Diagnosis Date   Chronic back pain    CVA (cerebral vascular accident) [facility])    September 2023   Hypertension    Lumbar radiculopathy     Patient Active Problem List   Diagnosis Date Noted   Hypertensive emergency 09/06/2022   Acute CVA (cerebrovascular accident) (HCC) 09/06/2022   Uncontrolled hypertension 09/06/2022   Smoker 09/06/2022   Heavy alcohol consumption 09/06/2022   Elevated LFTs 09/06/2022   Thrombocytopenia (HCC) 09/06/2022   Hyperglycemia 09/06/2022   GERD (gastroesophageal reflux disease) 09/06/2022   CLOSED FRACTURE OF BASE OF OTHER METACARPAL BONE 02/10/2011    Past Surgical History:  Procedure Laterality Date   HERNIA REPAIR         Home Medications    Prior to Admission medications   Medication Sig Start Date End Date Taking? Authorizing Provider  amLODipine (NORVASC) 10 MG tablet Take 1 tablet (10 mg total) by mouth daily. 09/08/22   Cleora Fleet, MD  aspirin 81 MG chewable tablet Chew 1 tablet (81 mg total) by mouth daily. 09/08/22   Johnson, Clanford L, MD  atorvastatin (LIPITOR) 40 MG tablet Take 1 tablet (40 mg total) by mouth every evening. 09/08/22   Johnson, Clanford L, MD  folic  acid (FOLVITE) 1 MG tablet Take 1 tablet (1 mg total) by mouth daily. 09/09/22   Johnson, Clanford L, MD  lansoprazole (PREVACID) 30 MG capsule Take 1 capsule (30 mg total) by mouth daily. 09/08/22 09/08/23  Johnson, Clanford L, MD  losartan (COZAAR) 100 MG tablet Take 1 tablet (100 mg total) by mouth daily. 01/19/23 01/14/24  Pricilla Riffle, MD  meclizine (ANTIVERT) 25 MG tablet Take 25 mg by mouth every 8 (eight) hours as needed for dizziness. 09/02/22   [provider]  Multiple Vitamin (MULTIVITAMIN WITH MINERALS) TABS tablet Take 1 tablet by mouth daily. 09/09/22   Johnson, Clanford L, MD  thiamine (VITAMIN B-1) 100 MG tablet Take 1 tablet (100 mg total) by mouth daily. 09/09/22   Cleora Fleet, MD    Family History Family History  Problem Relation Age of Onset   Diabetes Other        Family History    Heart disease Other        Family History    Arthritis Other        Family History     Social History Social History   Tobacco Use   Smoking status: Every Day    Packs/day: 1    Types: Cigarettes   Smokeless tobacco: Never  Vaping Use   Vaping Use: Never used  Substance Use Topics   Alcohol use: Not  Currently    Comment: Drinks 6 pack of beer per day    Drug use: No     Allergies   Patient has no known allergies.   Review of Systems Review of Systems Per HPI  Physical Exam Triage Vital Signs ED Triage Vitals  Enc Vitals Group     BP 05/14/23 0911 131/78     Pulse Rate 05/14/23 0911 (!) 58     Resp 05/14/23 0911 20     Temp 05/14/23 0911 98 F (36.7 C)     Temp Source 05/14/23 0911 Oral     SpO2 05/14/23 0911 96 %     Weight --      Height --      Head Circumference --      Peak Flow --      Pain Score 05/14/23 0912 9     Pain Loc --      Pain Edu? --      Excl. in GC? --    No data found.  Updated Vital Signs BP 131/78 (BP Location: Right Arm)   Pulse (!) 58   Temp 98 F (36.7 C) (Oral)   Resp 20   SpO2 96%   Visual Acuity Right Eye  Distance:   Left Eye Distance:   Bilateral Distance:    Right Eye Near:   Left Eye Near:    Bilateral Near:     Physical Exam Vitals and nursing note reviewed.  Constitutional:      General: He is not in acute distress.    Appearance: He is well-developed. He is not toxic-appearing.  HENT:     Head: Normocephalic and atraumatic.  Cardiovascular:     Rate and Rhythm: Regular rhythm. Bradycardia present.     Heart sounds: Normal heart sounds.  Pulmonary:     Effort: Pulmonary effort is normal. No tachypnea, accessory muscle usage or respiratory distress.     Breath sounds: No stridor.  Chest:     Chest wall: No mass, deformity or tenderness.  Skin:    General: Skin is warm and dry.     Capillary Refill: Capillary refill takes less than 2 seconds.     Coloration: Skin is not cyanotic or pale.     Findings: No erythema.  Neurological:     Mental Status: He is alert and oriented to person, place, and time.     Motor: No weakness.  Psychiatric:        Behavior: Behavior is cooperative.      UC Treatments / Results  Labs (all labs ordered are listed, but only abnormal results are displayed) Labs Reviewed - No data to display  EKG   Radiology No results found.  Procedures Procedures (including critical care time)  Medications Ordered in UC Medications - No data to display  Initial Impression / Assessment and Plan / UC Course  I have reviewed the triage vital signs and the nursing notes.  Pertinent labs & imaging results that were available during my care of the patient were reviewed by me and considered in my medical decision making (see chart for details).   Patient is well-appearing, normotensive, afebrile, not tachycardic, not tachypneic, oxygenating well on room air.    1. Chest tightness Concerned for cardiac etiology EKG today shows new left bundle branch block and aVL; T wave changes in lead III Chest tightness is not reproducible with  palpation Recommended transportation to hospital via EMS, however patient declines Patient agreeable to drive self  to ER immediately upon leaving urgent care; states he does not have family or friends he can call  The patient was given the opportunity to ask questions.  All questions answered to their satisfaction.  The patient is in agreement to this plan.    Final Clinical Impressions(s) / UC Diagnoses   Final diagnoses:  Chest tightness     Discharge Instructions      Please go directly to the ER for further evaluation and management of your chest tightness.    ED Prescriptions   None    PDMP not reviewed this encounter.   Valentino Nose, NP 05/14/23 [phone]

## 2023-05-21 ENCOUNTER — Encounter (HOSPITAL_COMMUNITY): Payer: Self-pay | Admitting: Radiology

## 2023-05-21 ENCOUNTER — Other Ambulatory Visit: Payer: Self-pay

## 2023-05-21 ENCOUNTER — Emergency Department (HOSPITAL_COMMUNITY): Payer: Medicare HMO

## 2023-05-21 ENCOUNTER — Emergency Department (HOSPITAL_COMMUNITY)
Admission: EM | Admit: 2023-05-21 | Discharge: 2023-05-21 | Disposition: A | Discharge status: Home / Self Care | Payer: Medicare HMO | Attending: Emergency Medicine | Admitting: Emergency Medicine

## 2023-05-21 DIAGNOSIS — I10 Essential (primary) hypertension: Secondary | ICD-10-CM | POA: Insufficient documentation

## 2023-05-21 DIAGNOSIS — Z79899 Other long term (current) drug therapy: Secondary | ICD-10-CM | POA: Diagnosis not present

## 2023-05-21 DIAGNOSIS — R209 Unspecified disturbances of skin sensation: Secondary | ICD-10-CM

## 2023-05-21 DIAGNOSIS — M2569 Stiffness of other specified joint, not elsewhere classified: Secondary | ICD-10-CM | POA: Diagnosis not present

## 2023-05-21 LAB — URINALYSIS, ROUTINE W REFLEX MICROSCOPIC
Bilirubin Urine: NEGATIVE
Glucose, UA: NEGATIVE mg/dL
Hgb urine dipstick: NEGATIVE
Ketones, ur: NEGATIVE mg/dL
Leukocytes,Ua: NEGATIVE
Nitrite: NEGATIVE
Protein, ur: NEGATIVE mg/dL
Specific Gravity, Urine: 1.014 (ref 1.005–1.030)
pH: 5 (ref 5.0–8.0)

## 2023-05-21 LAB — COMPREHENSIVE METABOLIC PANEL
ALT: 70 U/L — ABNORMAL HIGH (ref 0–44)
AST: 57 U/L — ABNORMAL HIGH (ref 15–41)
Albumin: 3.6 g/dL (ref 3.5–5.0)
Alkaline Phosphatase: 109 U/L (ref 38–126)
Anion gap: 8 (ref 5–15)
BUN: 17 mg/dL (ref 8–23)
CO2: 24 mmol/L (ref 22–32)
Calcium: 9.1 mg/dL (ref 8.9–10.3)
Chloride: 106 mmol/L (ref 98–111)
Creatinine, Ser: 1.27 mg/dL — ABNORMAL HIGH (ref 0.61–1.24)
GFR, Estimated: >60 mL/min (ref >60)
Glucose, Bld: 114 mg/dL — ABNORMAL HIGH (ref 70–99)
Potassium: 3.9 mmol/L (ref 3.5–5.1)
Sodium: 138 mmol/L (ref 135–145)
Total Bilirubin: 0.4 mg/dL (ref 0.3–1.2)
Total Protein: 7.2 g/dL (ref 6.5–8.1)

## 2023-05-21 LAB — DIFFERENTIAL
Abs Immature Granulocytes: 0.02 10*3/uL (ref 0.00–0.07)
Basophils Absolute: 0.1 10*3/uL (ref 0.0–0.1)
Basophils Relative: 1 %
Eosinophils Absolute: 0.4 10*3/uL (ref 0.0–0.5)
Eosinophils Relative: 4 %
Immature Granulocytes: 0 %
Lymphocytes Relative: 39 %
Lymphs Abs: 3.3 10*3/uL (ref 0.7–4.0)
Monocytes Absolute: 0.8 10*3/uL (ref 0.1–1.0)
Monocytes Relative: 10 %
Neutro Abs: 3.9 10*3/uL (ref 1.7–7.7)
Neutrophils Relative %: 46 %

## 2023-05-21 LAB — PROTIME-INR
INR: 1.1 (ref 0.8–1.2)
Prothrombin Time: 14.5 seconds (ref 11.4–15.2)

## 2023-05-21 LAB — RAPID URINE DRUG SCREEN, HOSP PERFORMED
Amphetamines: NOT DETECTED
Barbiturates: NOT DETECTED
Benzodiazepines: NOT DETECTED
Cocaine: NOT DETECTED
Opiates: NOT DETECTED
Tetrahydrocannabinol: NOT DETECTED

## 2023-05-21 LAB — CBC
HCT: 42.8 % (ref 39.0–52.0)
Hemoglobin: 14.4 g/dL (ref 13.0–17.0)
MCH: 30.4 pg (ref 26.0–34.0)
MCHC: 33.6 g/dL (ref 30.0–36.0)
MCV: 90.3 fL (ref 80.0–100.0)
Platelets: 184 10*3/uL (ref 150–400)
RBC: 4.74 MIL/uL (ref 4.22–5.81)
RDW: 13.6 % (ref 11.5–15.5)
WBC: 8.4 10*3/uL (ref 4.0–10.5)
nRBC: 0 % (ref 0.0–0.2)

## 2023-05-21 LAB — APTT: aPTT: 30 seconds (ref 24–36)

## 2023-05-21 LAB — ETHANOL: Alcohol, Ethyl (B): <10 mg/dL (ref <10)

## 2023-05-21 NOTE — ED Notes (Addendum)
Pt unable to provide a urine sample at this time but is aware we need one. Urinal placed at bedside.  Pt not wanting to do swallow screen at this time. Will continue care

## 2023-05-21 NOTE — ED Notes (Signed)
Bug found on bed crawling. ES called to verify if bedbug. Will shower pt while ES cleans room when they arrived. Pt aware awaiting MRI. Pt a/o. Nad. Denies pain. Watching tv upon entrance. Denies any needs at this time. Negative NIH.

## 2023-05-21 NOTE — ED Provider Notes (Signed)
MRI negative for acute stroke. I have personally viewed/interpreted these images.  From talking to him, it sounds like the symptoms have been ongoing for a while.  He will be referred to outpatient neurology.   Pricilla Loveless, MD 05/21/23 1003

## 2023-05-21 NOTE — ED Triage Notes (Signed)
Pt states that he has had stiffness on the left side in arm and leg for the past week. Pt has had problems since his stroke several months ago. PT endorses being seen at multiple hospitals and prescribed muscle relaxer's without relief.

## 2023-05-21 NOTE — ED Notes (Signed)
Pt in shower at this time. Evs cleaning room , after shower, pt going to MRI. Nad. EDP aware

## 2023-05-21 NOTE — ED Provider Notes (Signed)
[facility] Emergency Department Provider Note MRN:  [id number]  Arrival date & time: 05/21/23     Chief Complaint   stiffness   History of Present Illness   Randy Ashley is a 67 y.o. year-old male with a history of stroke, hypertension presenting to the ED with chief complaint of stiffness.  Patient explains that he has had left-sided deficits ever since his stroke.  Monday of this week his symptoms get a lot worse.  Slightly weaker in the arm and leg, but more noticeable as the decreased sensation in the arm and leg compared to his prior baseline.  Trouble getting around the house because of the change in symptoms.  Review of Systems  A thorough review of systems was obtained and all systems are negative except as noted in the HPI and PMH.   Patient's Health History    Past Medical History:  Diagnosis Date   Chronic back pain    CVA (cerebral vascular accident) [facility])    September 2023   Hypertension    Lumbar radiculopathy     Past Surgical History:  Procedure Laterality Date   HERNIA REPAIR      Family History  Problem Relation Age of Onset   Diabetes Other        Family History    Heart disease Other        Family History    Arthritis Other        Family History     Social History   Socioeconomic History   Marital status: Divorced    Spouse name: Not on file   Number of children: Not on file   Years of education: 12   Highest education level: Not on file  Occupational History   Occupation: dye and Ecologist   Tobacco Use   Smoking status: Every Day    Packs/day: 1    Types: Cigarettes   Smokeless tobacco: Never  Vaping Use   Vaping Use: Never used  Substance and Sexual Activity   Alcohol use: Not Currently    Comment: Drinks 6 pack of beer per day    Drug use: No   Sexual activity: Not on file  Other Topics Concern   Not on file  Social History Narrative   Not on file   Social Determinants of Health    Financial Resource Strain: Not on file  Food Insecurity: No Food Insecurity (09/06/2022)   Hunger Vital Sign    Worried About Running Out of Food in the Last Year: Never true    Ran Out of Food in the Last Year: Never true  Transportation Needs: No Transportation Needs (09/06/2022)   PRAPARE - Administrator, Civil Service (Medical): No    Lack of Transportation (Non-Medical): No  Physical Activity: Not on file  Stress: Not on file  Social Connections: Not on file  Intimate Partner Violence: Not At Risk (09/06/2022)   Humiliation, Afraid, Rape, and Kick questionnaire    Fear of Current or Ex-Partner: No    Emotionally Abused: No    Physically Abused: No    Sexually Abused: No     Physical Exam   Vitals:   05/21/23 0530 05/21/23 0545  BP: 126/72 121/70  Pulse: (!) 51 (!) 50  Resp: 17 14  Temp:    SpO2: 99% 100%    CONSTITUTIONAL: Well-appearing, NAD NEURO/PSYCH:  Alert and oriented x 3, subjective decrease sensation to left arm, left leg EYES:  eyes equal  and reactive ENT/NECK:  no LAD, no JVD CARDIO: Regular rate, well-perfused, normal S1 and S2 PULM:  CTAB no wheezing or rhonchi GI/GU:  non-distended, non-tender MSK/SPINE:  No gross deformities, no edema SKIN:  no rash, atraumatic   *Additional and/or pertinent findings included in MDM below  Diagnostic and Interventional Summary    EKG Interpretation  Date/Time:  Thursday May 21 2023 04:55:31 EDT Ventricular Rate:  53 PR Interval:  194 QRS Duration: 101 QT Interval:  433 QTC Calculation: 407 R Axis:   16 Text Interpretation: Sinus rhythm Probable anteroseptal infarct, old Confirmed by Kennis Carina [phone]) on 05/21/2023 6:20:47 AM       Labs Reviewed  COMPREHENSIVE METABOLIC PANEL - Abnormal; Notable for the following components:      Result Value   Glucose, Bld 114 (*)    Creatinine, Ser 1.27 (*)    AST 57 (*)    ALT 70 (*)    All other components within normal limits  ETHANOL  CBC   DIFFERENTIAL  PROTIME-INR  APTT  RAPID URINE DRUG SCREEN, HOSP PERFORMED  URINALYSIS, ROUTINE W REFLEX MICROSCOPIC  I-STAT CHEM 8, ED    MR BRAIN WO CONTRAST    (Results Pending)    Medications - No data to display   Procedures  /  Critical Care Procedures  ED Course and Medical Decision Making  Initial Impression and Ddx Question acute stroke versus recrudescence versus old stroke symptoms with anxiety.  He is coming in fairly frequently for assessments.  Has not had an MRI since October, has had multiple recent CT Noncon studies that are without acute process.  Based on the history provided today, will obtain MRI.  Past medical/surgical history that increases complexity of ED encounter: Stroke  Interpretation of Diagnostics I personally reviewed the EKG and my interpretation is as follows: Sinus rhythm  Labs without significant blood count or electrolyte disturbance  Patient Reassessment and Ultimate Disposition/Management     Awaiting MRI.  If negative suspect patient can be discharged.  Signed out to oncoming provider at shift change.  Patient management required discussion with the following services or consulting groups:  None  Complexity of Problems Addressed Acute illness or injury that poses threat of life of bodily function  Additional Data Reviewed and Analyzed Further history obtained from: Care Everywhere and Prior labs/imaging results  Additional Factors Impacting ED Encounter Risk Consideration of hospitalization  Elmer Sow. Pilar Plate, MD [facility] Emergency Medicine [facility] [email or web address]  Final Clinical Impressions(s) / ED Diagnoses     ICD-10-CM   1. Sensory disturbance  R20.9       ED Discharge Orders     None        Discharge Instructions Discussed with and Provided to Patient:   Discharge Instructions   None      Sabas Sous, MD 05/21/23 [phone]

## 2023-05-21 NOTE — ED Notes (Signed)
Pt taken to MRI  

## 2023-05-21 NOTE — ED Notes (Addendum)
Pt returned from mri. Nad. No changes. Room was cleaned while pt in MRI, all linens and pt belongings double bagged.

## 2023-05-21 NOTE — Discharge Instructions (Signed)
If you develop new or worsening weakness or numbness, headache, fever, or any other new/concerning symptoms then return to the ER or call 911.

## 2023-06-08 NOTE — Progress Notes (Unsigned)
Cardiology Office Note   Date:  06/09/2023   ID:  Randy Ashley, Randy Ashley [birth year], MRN [id number]  PCP:  Gareth Morgan, MD  Cardiologist:   Dietrich Pates, MD   Pt presents for following HTN     History of Present Illness: Randy Ashley is a 67 y.o. male with a history of HTN, CVA, alcohol abuse    He was admitted on 09/08/22 with L sided numbness.    BP was severely elevated       The pt had an MRI that showed multiple punctate lesions   Echo done with bubble study     LVEF normal   Bubble study negative       I saw the pt in clinic in Oct 2023   30 day monitor showed normal SR   NO afib     I saw the pt in Feb 2024 The pt says he was doing OK then   But since he has developed intermitt L sided numbness/tightness in legs and arms and abdomen.   Not in bed ut when he is up and around   Started over the last month to get worse   Not working  now  He has been seen in ER  On 05/14/23 he ws seen in ER for chst tightness  Note says it occurred 2 hours after waking   EKG done  Note says it showed LBBB   I do not see that on EKG   The pt says now that he did not have CP   More abdominal discomfort   Breathing is OK   No CP   No dizziness    Biggest complaint is numbness on L    Current Meds  Medication Sig   amLODipine (NORVASC) 10 MG tablet Take 1 tablet (10 mg total) by mouth daily.   ASPIRIN LOW DOSE 81 MG tablet Take 81 mg by mouth daily.   atorvastatin (LIPITOR) 40 MG tablet Take 1 tablet (40 mg total) by mouth every evening.   cyclobenzaprine (FLEXERIL) 10 MG tablet Take by mouth.   folic acid (FOLVITE) 1 MG tablet Take 1 tablet (1 mg total) by mouth daily.   gabapentin (NEURONTIN) 300 MG capsule Take by mouth.   lansoprazole (PREVACID) 30 MG capsule Take 1 capsule (30 mg total) by mouth daily.   losartan (COZAAR) 100 MG tablet Take 1 tablet (100 mg total) by mouth daily.   Multiple Vitamin (MULTIVITAMIN WITH MINERALS) TABS tablet Take 1 tablet by mouth daily.   thiamine (VITAMIN  B1) 100 MG tablet Take 100 mg by mouth daily.     Allergies:   Patient has no known allergies.   Past Medical History:  Diagnosis Date   Chronic back pain    CVA (cerebral vascular accident) [facility])    September 2023   Hypertension    Lumbar radiculopathy     Past Surgical History:  Procedure Laterality Date   HERNIA REPAIR       Social History:  The patient  reports that he has been smoking cigarettes. He has been smoking an average of 1 pack per day. He has never used smokeless tobacco. He reports that he does not currently use alcohol. He reports that he does not use drugs.   Family History:  The patient's family history includes Arthritis in an other family member; Diabetes in an other family member; Heart disease in an other family member.    ROS:  Please see the history of present illness.  All other systems are reviewed and  Negative to the above problem except as noted.    PHYSICAL EXAM: VS:  BP 126/60   Pulse 77   Ht 5\' 7"  (1.702 m)   Wt 147 lb (66.7 kg)   SpO2 96%   BMI 23.02 kg/m   GEN: Well nourished, well developed, in no acute distress  HEENT: normal  Neck: no JVD, no carotid bruit  R neck with ? Sebacous cyst/epidremoid cyst.     Back:   Soft tissue masses upper back (central/right)   on smaller nodule (? Sebacous cyst)   One larger, softer    Cardiac: RRR; no murmur  No LE edema  Respiratory:  clear to auscultation  GI: soft, nontender, nondistended, No hepatomegaly  MS: no deformity Moving all extremities   Neuro:  CN II to XII intact   Moving all extremites      EKG:  EKG is not  ordered today.   Lipid Panel    Component Value Date/Time   CHOL 133 09/08/2022 1327   TRIG 157 (H) 09/08/2022 1327   HDL 23 (L) 09/08/2022 1327   CHOLHDL 5.8 09/08/2022 1327   VLDL 31 09/08/2022 1327   LDLCALC 79 09/08/2022 1327      Wt Readings from Last 3 Encounters:  06/09/23 147 lb (66.7 kg)  05/21/23 141 lb (64 kg)  05/10/23 141 lb 1.5 oz (64 kg)       ASSESSMENT AND PLAN:  1  Neuro  CVA in 2023  Felt to be embolic Bubble study neg  Monitor did not show arrhythmia Did not have LINQ done  will reivew with EP  2  L sided numbness/ stiffness.   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  The patient does not have concerns regarding medicines.  Signed, Dietrich Pates, MD  06/09/2023 7:05 PM    [facility] [address] [place], [place], [place]  [address] Phone: [phone]; Fax: [phone]  ++

## 2023-06-09 ENCOUNTER — Encounter: Payer: Self-pay | Admitting: Internal Medicine

## 2023-06-09 ENCOUNTER — Ambulatory Visit: Payer: Medicare HMO | Attending: Internal Medicine | Admitting: Internal Medicine

## 2023-06-09 ENCOUNTER — Other Ambulatory Visit (HOSPITAL_COMMUNITY)
Admission: RE | Admit: 2023-06-09 | Discharge: 2023-06-09 | Disposition: A | Discharge status: Home / Self Care | Payer: Medicare HMO | Source: Ambulatory Visit | Attending: Internal Medicine | Admitting: Internal Medicine

## 2023-06-09 VITALS — BP 126/60 | HR 77 | Ht 67.0 in | Wt 147.0 lb

## 2023-06-09 DIAGNOSIS — Z79899 Other long term (current) drug therapy: Secondary | ICD-10-CM | POA: Diagnosis not present

## 2023-06-09 LAB — COMPREHENSIVE METABOLIC PANEL
ALT: 56 U/L — ABNORMAL HIGH (ref 0–44)
AST: 44 U/L — ABNORMAL HIGH (ref 15–41)
Albumin: 3.8 g/dL (ref 3.5–5.0)
Alkaline Phosphatase: 109 U/L (ref 38–126)
Anion gap: 9 (ref 5–15)
BUN: 18 mg/dL (ref 8–23)
CO2: 24 mmol/L (ref 22–32)
Calcium: 9.1 mg/dL (ref 8.9–10.3)
Chloride: 105 mmol/L (ref 98–111)
Creatinine, Ser: 1.17 mg/dL (ref 0.61–1.24)
GFR, Estimated: >60 mL/min (ref >60)
Glucose, Bld: 115 mg/dL — ABNORMAL HIGH (ref 70–99)
Potassium: 4.3 mmol/L (ref 3.5–5.1)
Sodium: 138 mmol/L (ref 135–145)
Total Bilirubin: 0.5 mg/dL (ref 0.3–1.2)
Total Protein: 8.1 g/dL (ref 6.5–8.1)

## 2023-06-09 NOTE — Patient Instructions (Signed)
Medication Instructions:  Your physician recommends that you continue on your current medications as directed. Please refer to the Current Medication list given to you today.   Labwork: CMET, Lipomed  Testing/Procedures: None today  Follow-Up: November or December  Any Other Special Instructions Will Be Listed Below (If Applicable).  If you need a refill on your cardiac medications before your next appointment, please call your pharmacy.

## 2023-06-10 ENCOUNTER — Telehealth: Payer: Self-pay

## 2023-06-10 DIAGNOSIS — L989 Disorder of the skin and subcutaneous tissue, unspecified: Secondary | ICD-10-CM

## 2023-06-10 NOTE — Telephone Encounter (Signed)
Referral placed to Dr.McConnell with Dr.Hall's group in [place]

## 2023-06-10 NOTE — Telephone Encounter (Signed)
-----   Message from Pricilla Riffle, MD sent at 06/09/2023  7:27 PM EDT ----- Needs referral to derm  Back lesions

## 2023-06-12 ENCOUNTER — Telehealth: Payer: Self-pay

## 2023-06-12 DIAGNOSIS — I639 Cerebral infarction, unspecified: Secondary | ICD-10-CM

## 2023-06-12 LAB — NMR, LIPOPROFILE

## 2023-06-12 NOTE — Telephone Encounter (Signed)
-----   Message from Pricilla Riffle, MD sent at 06/12/2023  6:19 AM EDT ----- Lynden Ang,  Please place referral for PT and OT Also, pt should really have follow up in neuro given change in symptoms     Dietrich Pates ----- Message ----- From: Bella Kennedy, PT Sent: 06/10/2023   9:51 AM EDT To: Pricilla Riffle, MD  Yes, by all means!.   Can you have your staff send a referral in epic?  Is it both his arm and leg?  If so he most likely will benefit from both an OT and PT referral.  OT tends to work with the arm while PT tends to work with the leg.    Thanks ----- Message ----- From: Pricilla Riffle, MD Sent: 06/09/2023   7:29 PM EDT To: Bella Kennedy, PT  I saw pt in clinic.  He says he is bothered by recurrent numbness, stiffness on L side     Can he be reevaluated?

## 2023-06-12 NOTE — Telephone Encounter (Signed)
Referrals placed 

## 2023-07-23 ENCOUNTER — Ambulatory Visit: Payer: Medicare HMO | Attending: Internal Medicine | Admitting: Internal Medicine

## 2023-07-23 VITALS — BP 122/72 | HR 74 | Ht 66.0 in | Wt 154.0 lb

## 2023-07-23 DIAGNOSIS — I639 Cerebral infarction, unspecified: Secondary | ICD-10-CM | POA: Diagnosis not present

## 2023-07-23 NOTE — Patient Instructions (Signed)
Medication Instructions:   Please call if you decide to have Loop Recorder placed.   *If you need a refill on your cardiac medications before your next appointment, please call your pharmacy*   Lab Work: NONE   If you have labs (blood work) drawn today and your tests are completely normal, you will receive your results only by: MyChart Message (if you have MyChart) OR A paper copy in the mail If you have any lab test that is abnormal or we need to change your treatment, we will call you to review the results.   Testing/Procedures: NONE    Follow-Up: At [facility], you and your health needs are our priority.  As part of our continuing mission to provide you with exceptional heart care, we have created designated Provider Care Teams.  These Care Teams include your primary Cardiologist (physician) and Advanced Practice Providers (APPs -  Physician Assistants and Nurse Practitioners) who all work together to provide you with the care you need, when you need it.  We recommend signing up for the patient portal called "MyChart".  Sign up information is provided on this After Visit Summary.  MyChart is used to connect with patients for Virtual Visits (Telemedicine).  Patients are able to view lab/test results, encounter notes, upcoming appointments, etc.  Non-urgent messages can be sent to your provider as well.   To learn more about what you can do with MyChart, go to [email or web address].    Your next appointment:    To Be Determined   Provider:   Lewayne Bunting, MD    Other Instructions Thank you for choosing [facility]!

## 2023-07-23 NOTE — Progress Notes (Signed)
HPI Randy Ashley is referred by Dr. Tenny Craw to consider ILR insertion. 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 He improved, going through PT. He had an echo which was negative. He denies palpitations and has never had atrial fib. He denies chest pain or sob. No syncope. No Known Allergies   Current Outpatient Medications  Medication Sig Dispense Refill   amLODipine (NORVASC) 10 MG tablet Take 1 tablet (10 mg total) by mouth daily. 30 tablet 1   ASPIRIN LOW DOSE 81 MG tablet Take 81 mg by mouth daily.     atorvastatin (LIPITOR) 40 MG tablet Take 1 tablet (40 mg total) by mouth every evening. 30 tablet 1   cyclobenzaprine (FLEXERIL) 10 MG tablet Take by mouth.     folic acid (FOLVITE) 1 MG tablet Take 1 tablet (1 mg total) by mouth daily. 30 tablet 1   gabapentin (NEURONTIN) 300 MG capsule Take by mouth.     lansoprazole (PREVACID) 30 MG capsule Take 1 capsule (30 mg total) by mouth daily. 30 capsule 1   losartan (COZAAR) 100 MG tablet Take 1 tablet (100 mg total) by mouth daily. 90 tablet 3   Multiple Vitamin (MULTIVITAMIN WITH MINERALS) TABS tablet Take 1 tablet by mouth daily. 30 tablet 1   thiamine (VITAMIN B1) 100 MG tablet Take 100 mg by mouth daily.     No current facility-administered medications for this visit.     Past Medical History:  Diagnosis Date   Chronic back pain    CVA (cerebral vascular accident) [facility])    September 2023   Hypertension    Lumbar radiculopathy     ROS:   All systems reviewed and negative except as noted in the HPI.   Past Surgical History:  Procedure Laterality Date   HERNIA REPAIR       Family History  Problem Relation Age of Onset   Diabetes Other        Family History    Heart disease Other        Family History    Arthritis Other        Family History      Social History   Socioeconomic History   Marital status: Divorced    Spouse name: Not on file   Number of children: Not on file   Years of education:  12   Highest education level: Not on file  Occupational History   Occupation: dye and Ecologist   Tobacco Use   Smoking status: Every Day    Current packs/day: 1.00    Types: Cigarettes   Smokeless tobacco: Never  Vaping Use   Vaping status: Never Used  Substance and Sexual Activity   Alcohol use: Not Currently    Comment: Drinks 6 pack of beer per day    Drug use: No   Sexual activity: Not on file  Other Topics Concern   Not on file  Social History Narrative   Not on file   Social Determinants of Health   Financial Resource Strain: Not on file  Food Insecurity: No Food Insecurity (09/06/2022)   Hunger Vital Sign    Worried About Running Out of Food in the Last Year: Never true    Ran Out of Food in the Last Year: Never true  Transportation Needs: No Transportation Needs (09/06/2022)   PRAPARE - Administrator, Civil Service (Medical): No    Lack of Transportation (Non-Medical): No  Physical  Activity: Not on file  Stress: Not on file  Social Connections: Not on file  Intimate Partner Violence: Not At Risk (09/06/2022)   Humiliation, Afraid, Rape, and Kick questionnaire    Fear of Current or Ex-Partner: No    Emotionally Abused: No    Physically Abused: No    Sexually Abused: No     BP 122/72   Pulse 74   Ht 5\' 6"  (1.676 m)   Wt 154 lb (69.9 kg)   SpO2 96%   BMI 24.86 kg/m   Physical Exam:  Well appearing NAD HEENT: Unremarkable Neck:  No JVD, no thyromegally Lymphatics:  No adenopathy Back:  No CVA tenderness Lungs:  Clear with no wheezes HEART:  Regular rate rhythm, no murmurs, no rubs, no clicks Abd:  soft, positive bowel sounds, no organomegally, no rebound, no guarding Ext:  2 plus pulses, no edema, no cyanosis, no clubbing Skin:  No rashes no nodules Neuro:  CN II through XII intact, motor grossly intact  Assess/Plan: Cryptogenic stroke - I have discussed the indications for ILR insertion and he will call us if he would like to  proceed with ILR.  HTN -his bp is better controlled. We will follow.

## 2023-08-26 ENCOUNTER — Encounter: Payer: Self-pay | Admitting: Neurology

## 2023-08-27 NOTE — Progress Notes (Signed)
Initial neurology clinic note  Randy Ashley MRN: [id number] DOB: Oct 13, 1956  Referring provider: Katherine Basset*  Primary care provider: Gareth Morgan, MD  Reason for consult:  stroke  Subjective:  This is [name], a 67 y.o. left-handed male with a medical history of stroke, current smoker, EtOH abuse, HTN, HLD, GERD, lumbar radiculopathy who presents to neurology clinic with stroke. The patient is alone today.  Patient got up one morning prior to work (09/05/22) and did not feel great. He had been drinking EtOH the night before and thought that was the reason. He continued to not feel well after his shower, so he decided to go to the hospital. Patient mentioned numbness on the left side. This continues to current time. His BP on arrival was 214/83. NIHSS was 1 for diminished sensation on left compared to right. MRI brain showed multiple infarcts (right pons, right thalamus, right frontoparietal white matter, right posterior frontal white matter, and anterior right subinsular white matter). There were also chronic infarcts in right corona radiata/basal ganglia and bilateral deep gray nuclei. MRA showed no large vessel occlusion but intracranial atherosclerotic disease, with moderate to severe stenosis in bilateral PCA at P2/P3 junctions. Patient was given DAPT (asa and plavix) for 21 days and atorvastatin 40 mg daily. Echo was unremarkable with no shunt seen on agitated saline. 30 day holter showed no significant arrhythmias. Cardiology has discussed loop recorder with patient, but he is still considering this.   Patient continues to have symptoms, and has presented to ED multiple times. Most recent MRI brain on 05/21/23 showed no acute infarct. MRA on 05/21/23 showed no significant stenosis or large vessel occlusion.  Patient went to PT. He continues to do exercises at home.  Patient continues to smoke about 10-15 cigarettes.  EtOH use: none since stroke  Patient is  currently on asa 81 mg, atorvastatin 40 mg for secondary stroke prevention. He is also on thiamine 100 mg daily. He takes gabapentin 300 mg TID, but he denies any tingling or burning pain.  MEDICATIONS:  Outpatient Encounter Medications as of 09/10/2023  Medication Sig   amLODipine (NORVASC) 10 MG tablet Take 1 tablet (10 mg total) by mouth daily.   ASPIRIN LOW DOSE 81 MG tablet Take 81 mg by mouth daily.   atorvastatin (LIPITOR) 40 MG tablet Take 1 tablet (40 mg total) by mouth every evening.   cyclobenzaprine (FLEXERIL) 10 MG tablet Take by mouth.   folic acid (FOLVITE) 1 MG tablet Take 1 tablet (1 mg total) by mouth daily.   gabapentin (NEURONTIN) 300 MG capsule Take by mouth.   lansoprazole (PREVACID) 30 MG capsule Take 1 capsule (30 mg total) by mouth daily.   losartan (COZAAR) 100 MG tablet Take 1 tablet (100 mg total) by mouth daily.   Multiple Vitamin (MULTIVITAMIN WITH MINERALS) TABS tablet Take 1 tablet by mouth daily.   thiamine (VITAMIN B1) 100 MG tablet Take 100 mg by mouth daily.   No facility-administered encounter medications on file as of 09/10/2023.    PAST MEDICAL HISTORY: Past Medical History:  Diagnosis Date   Chronic back pain    CVA (cerebral vascular accident) [facility])    September 2023   Hypertension    Lumbar radiculopathy     PAST SURGICAL HISTORY: Past Surgical History:  Procedure Laterality Date   HERNIA REPAIR      ALLERGIES: No Known Allergies  FAMILY HISTORY: Family History  Problem Relation Age of Onset   Heart disease Mother  Heart disease Father    Diabetes Other        Family History    Heart disease Other        Family History    Arthritis Other        Family History     SOCIAL HISTORY: Social History   Tobacco Use   Smoking status: Every Day    Current packs/day: 1.00    Types: Cigarettes   Smokeless tobacco: Never   Tobacco comments:    1/2 pack a day 09/10/23  Vaping Use   Vaping status: Never Used  Substance Use  Topics   Alcohol use: Not Currently    Comment: Drinks 6 pack of beer per day    Drug use: No   Social History   Social History Narrative   Are you right handed or left handed? left   Are you currently employed ? no   What is your current occupation? retired   Do you live at home alone?yes   Who lives with you?    What type of home do you live in: 1 story or 2 story? one   Caffeine daily    Objective:  Vital Signs:  BP (!) 158/77   Pulse 76   Ht 5\' 7"  (1.702 m)   Wt 157 lb (71.2 kg)   SpO2 97%   BMI 24.59 kg/m   General: No acute distress.  Patient appears well-groomed.   Head:  Normocephalic/atraumatic Neck: supple, lipoma on posterior left neck Heart: regular rate and rhythm Lungs: Clear to auscultation bilaterally. Vascular: No carotid bruits.  Neurological Exam: Mental status: alert and oriented, speech fluent and not dysarthric, language intact.  Cranial nerves: CN I: not tested CN II: pupils equal, round and reactive to light, visual fields intact CN III, IV, VI:  full range of motion, no nystagmus, no ptosis CN V: facial sensation intact. CN VII: upper and lower face symmetric CN VIII: hearing intact CN IX, X: uvula midline CN XI: sternocleidomastoid and trapezius muscles intact CN XII: tongue midline  Bulk & Tone: normal, no fasciculations. Motor:  muscle strength 5/5 throughout Deep Tendon Reflexes:  2+ throughout.   Sensation:  Pinprick sensation intact except 25% of normal in left forearm compared to right. Finger to nose testing:  Without dysmetria.   Heel to shin:  Without dysmetria.   Gait:  Normal station and stride.  Romberg negative.   Labs and Imaging review: Internal labs: Lab Results  Component Value Date   HGBA1C 5.8 (H) 09/06/2022   No results found for: "VITAMINB12" Lab Results  Component Value Date   TSH 0.672 09/06/2022   Lab Results  Component Value Date   ESRSEDRATE 2 09/11/2015   CMP (06/09/23) significant for mildly  elevated glucose, AST, and ALT  Lipid panel (09/08/22): Component     Latest Ref Rng 09/08/2022  Cholesterol     0 - 200 mg/dL 696   Triglycerides     <150 mg/dL 295 (H)   HDL Cholesterol     >40 mg/dL 23 (L)   Total CHOL/HDL Ratio     RATIO 5.8   VLDL     0 - 40 mg/dL 31   LDL (calc)     0 - 99 mg/dL 79     Imaging: MRI brain wo contrast and MRA head (09/08/22): I personally reviewed images and agree with radiology read below. FINDINGS: MRI HEAD FINDINGS   Brain:   No age advanced or lobar predominant parenchymal  atrophy.   Multiple recent infarcts, as follows.   7 mm acute infarct within the dorsal right pons (series 5, image 9).   4 mm acute infarct within the right thalamus (series 5, image 18).   4 mm acute/early subacute infarct within the right frontoparietal periventricular white matter (series 7, image 12) (series 5, image 24).   6 mm acute/early subacute infarct within the posterior right frontal lobe white matter (series 5, image 28).   4 mm acute/early subacute infarct within the anterior right subinsular white matter (series 5, image 19).   Chronic small-vessel infarct within the right corona radiata/basal ganglia.   Chronic lacunar infarcts elsewhere within the bilateral deep gray nuclei.   Background mild multifocal T2 FLAIR hyperintense signal abnormality within the cerebral white matter, nonspecific but compatible with chronic small vessel disease.   No evidence of an intracranial mass.   No chronic intracranial blood products.   No extra-axial fluid collection.   No midline shift.   Vascular: Maintained flow voids within the proximal large arterial vessels.   Skull and upper cervical spine: No focal suspicious marrow lesion.   Sinuses/Orbits: No mass or acute finding within the imaged orbits. No significant paranasal sinus disease.   Other: Trace fluid within the left mastoid air cells. 1.6 cm lesion within the posterior right  upper neck with associated restricted diffusion, nonspecific but likely reflecting an epidermal inclusion cyst.   MRA HEAD FINDINGS   Anterior circulation:   The intracranial internal carotid arteries are patent. The M1 middle cerebral arteries are patent. Atherosclerotic irregularity of the M2 and more distal MCA vessels, bilaterally. No M2 proximal branch occlusion or high-grade proximal stenosis is identified. The anterior cerebral arteries are patent. No intracranial aneurysm is identified.   Posterior circulation:   The intracranial vertebral arteries are patent. The basilar artery is patent. The posterior cerebral arteries are patent. Moderate/severe focal stenosis within the right PCA at P2/P3 junction. Moderate/severe focal stenosis within the left PCA at the P2/P3 junction. Posterior communicating arteries are present bilaterally.   Anatomic variants: None significant   IMPRESSION: MRI brain:   1. 7 mm acute infarct within the dorsal right pons. 2. 4 mm acute infarct within the right thalamus. 3. 4 mm acute/early subacute infarct within the right frontoparietal periventricular white matter. 4. 6 mm acute/early subacute infarct within the posterior right frontal lobe white matter. 5. 4 mm acute/early subacute infarct within the anterior right subinsular white matter 6. Given that the above described infarcts involve multiple vascular territories, consider an embolic process. 7. Background chronic small vessel ischemic disease with multiple chronic small-vessel infarcts, as described.   MRA head:   1. No intracranial large vessel occlusion is identified. 2. Intracranial atherosclerotic disease. Most notably, there are apparent moderate/severe stenoses within the bilateral posterior cerebral arteries at the P2/P3 junctions.  CTA head and neck (09/09/22): Aortic arch: Common origin of the innominate and left common carotid arteries. Atherosclerotic plaque within  the visualized aortic arch and proximal major branch vessels of the neck. Streak and beam hardening artifact arising from a dense right-sided contrast bolus partially obscures the right subclavian artery. Within this limitation, there is no appreciable hemodynamically significant innominate or proximal subclavian artery stenosis.   Right carotid system: CCA and ICA patent within the neck without stenosis. Minimal soft plaque within   Left carotid system: The carotid bulb. CCA and ICA patent within the neck without stenosis. Mild osteophyte plaque about the carotid bifurcation.   Vertebral arteries: The  vertebral arteries are patent within the neck without stenosis. The left vertebral artery is slightly dominant.   Skeleton: Cervical spondylosis. Disc space narrowing is advanced at C5-C6. No acute fracture or aggressive osseous lesion.   Other neck: Redemonstrated 2.1 cm subcutaneous cystic lesion within the posterior right upper neck.   Upper chest: Streak and beam hardening artifact arising from a dense right-sided contrast bolus limits evaluation of the imaged lung apices. Within this limitation, there is no appreciable airspace consolidation at the imaged levels.   Review of the MIP images confirms the above findings   CTA HEAD FINDINGS   Anterior circulation:   The intracranial internal carotid arteries are patent. Non-stenotic atherosclerotic plaque within both vessels. The M1 middle cerebral arteries are patent. No M2 proximal branch occlusion or high-grade proximal stenosis. The anterior cerebral arteries are patent. No intracranial aneurysm is identified.   Posterior circulation:   The intracranial vertebral arteries are patent. The basilar artery is patent. The posterior cerebral arteries are patent. Posterior communicating arteries are present bilaterally.   Venous sinuses: Within the limitations of contrast timing, no convincing thrombus.   Anatomic  variants: None significant.   Review of the MIP images confirms the above findings   IMPRESSION: CT head:   1. The acute/early subacute infarcts within the dorsal right pons, right thalamus and right cerebral white matter demonstrated on yesterday's brain MRI are occult by CT. 2. No CT evidence of interval acute intracranial abnormality. 3. Redemonstrated chronic small-vessel infarct within the right corona radiata/basal ganglia. 4. Background mild cerebral white matter chronic small vessel ischemic disease.   CTA neck:   1. The common carotid and internal carotid arteries are patent within the neck without stenosis. Mild atherosclerotic plaque, bilaterally. 2. The vertebral arteries are patent within the neck without stenosis or significant atherosclerotic disease. 3. Aortic Atherosclerosis (ICD10-I70.0).   CTA head:   1. No intracranial large vessel occlusion or proximal high-grade arterial stenosis identified. 2. Non-stenotic atherosclerotic plaque within the intracranial ICAs, bilaterally.  MRI brain and MRA head wo contrast (05/21/23): I personally reviewed images and agree with radiology read below. FINDINGS: MRI HEAD FINDINGS   Brain: No acute infarct or hemorrhage. Unchanged mild chronic small-vessel disease and old infarcts in the right corona radiata and right pons. No mass or midline shift. No hydrocephalus or extra-axial collection. No abnormal susceptibility.   Vascular: Normal flow voids.   Skull and upper cervical spine: Normal marrow signal.   Sinuses/Orbits: Unremarkable.   Other: Epidermal inclusion cysts in the right posterior neck soft tissues.   MRA HEAD FINDINGS   Anterior circulation: Intracranial ICAs are patent without stenosis or aneurysm. The proximal ACAs and MCAs are patent without stenosis or aneurysm. Distal branches are symmetric.   Posterior circulation: Visualized portions of the distal vertebral arteries and basilar artery  are patent without stenosis or aneurysm. Asymmetric small caliber of the right AICA. The SCAs and PICAs are patent proximally. The PCAs are patent proximally without stenosis or aneurysm. Distal branches are symmetric.   Anatomic variants: None.   IMPRESSION: 1. No acute intracranial process. 2. No intracranial large vessel occlusion or significant stenosis.  Echocardiogram (09/08/22): 1. Left ventricular ejection fraction, by estimation, is 60 to 65%. The  left ventricle has normal function. The left ventricle has no regional  wall motion abnormalities. Left ventricular diastolic parameters were  normal.   2. Right ventricular systolic function is normal. The right ventricular  size is normal. Tricuspid regurgitation signal is inadequate for assessing  PA pressure.   3. The mitral valve is normal in structure. No evidence of mitral valve  regurgitation. No evidence of mitral stenosis.   4. The aortic valve is tricuspid. Aortic valve regurgitation is not  visualized. No aortic stenosis is present.   5. The inferior vena cava is normal in size with greater than 50%  respiratory variability, suggesting right atrial pressure of 3 mmHg.   6. Agitated saline contrast bubble study was negative, with no evidence  of any interatrial shunt.  LA normal size.  30 day heart monitor (09/30/22):   30 day monitor, data available from 82% of planned monitored time   Min HR 45, Max HR 151, Avg HR 67   Reported symptoms correlated with normal sinus rhythm   No significant arrhythmias  Assessment/Plan:  Bryam Bandt is a 67 y.o. male who presents for evaluation of left sided numbness since stroke in 08/2022. He has a relevant medical history of stroke, current smoker, EtOH abuse, HTN, HLD, GERD, lumbar radiculopathy. His neurological examination is pertinent for diminished sensation on left arm and subjective numbness in left leg. Available diagnostic data is significant for MRI brain showing  multiple prior infarcts in deep white matter and brainstem (right side more than left). MRA in 2023 showed intracranial atherosclerosis. Patient's symptoms of left sided numbness are the residuals of his stroke. We discussed reducing future stroke risk by better controlling his risk factors, namely HTN, HLD, and smoking.   PLAN: -Continue aspirin 81 mg daily. -Continue atorvastatin 40 mg daily. Patient endorses not taking this recently. -Discussed importance of BP control. Patient will continue to work with PCP on this. -Discussed smoking cessation. Patient to think about this and reach out to PCP on options to help him quit. -Discussed gabapentin with patient. Given he is not having pain, he likely does not need it. He will stop this medication and go back on it if he has tingling or burning. -Discussed stroke warning signs  -Return to clinic as needed  The impression above as well as the plan as outlined below were extensively discussed with the patient who voiced understanding. All questions were answered to their satisfaction.  When available, results of the above investigations and possible further recommendations will be communicated to the patient via telephone/MyChart. Patient to call office if not contacted after expected testing turnaround time.   Thank you for allowing me to participate in patient's care.  If I can answer any additional questions, I would be pleased to do so.  Jacquelyne Balint, MD   CC: Gareth Morgan, MD [address] [place] [address]  CC: Referring provider: Kara Pacer, NP [address] [place],  [place] [address]

## 2023-09-07 LAB — COLOGUARD: COLOGUARD: POSITIVE — AB

## 2023-09-10 ENCOUNTER — Encounter: Payer: Self-pay | Admitting: Neurology

## 2023-09-10 ENCOUNTER — Ambulatory Visit: Payer: Medicare HMO | Admitting: Neurology

## 2023-09-10 VITALS — BP 122/78 | HR 76 | Ht 67.0 in | Wt 157.0 lb

## 2023-09-10 DIAGNOSIS — I6381 Other cerebral infarction due to occlusion or stenosis of small artery: Secondary | ICD-10-CM

## 2023-09-10 DIAGNOSIS — R200 Anesthesia of skin: Secondary | ICD-10-CM | POA: Diagnosis not present

## 2023-09-10 DIAGNOSIS — I10 Essential (primary) hypertension: Secondary | ICD-10-CM

## 2023-09-10 DIAGNOSIS — E785 Hyperlipidemia, unspecified: Secondary | ICD-10-CM

## 2023-09-10 DIAGNOSIS — F17200 Nicotine dependence, unspecified, uncomplicated: Secondary | ICD-10-CM

## 2023-09-10 NOTE — Patient Instructions (Addendum)
I saw you today for previous strokes. The left sided numbness you are feeling is a residual of your previous stroke. To prevent future stroke, you should work on stopping smoking, improving your blood pressure, and improving cholesterol.  Continue all of your medications, especially your blood pressure medications, aspirin 81 mg daily, and atorvastatin 40 mg daily for cholesterol.  You can stop the gabapentin if you would like as you do not have pain, tingling, or burning.  If you have new difficulty speaking, face droop, numbness on one side of the body, weakness on one side of the body, or dizziness/imbalance, this could be the sign of a stroke. Don't wait, please call EMS and be evaluated at the nearest emergency room.   Return to see me as needed. Please let me know if you have any questions or concerns in the meantime.   The physicians and staff at [facility] are committed to providing excellent care. You may receive a survey requesting feedback about your experience at our office. We strive to receive "very good" responses to the survey questions. If you feel that your experience would prevent you from giving the office a "very good " response, please contact our office to try to remedy the situation. We may be reached at [phone]. Thank you for taking the time out of your busy day to complete the survey.  Jacquelyne Balint, MD [facility]

## 2023-10-12 ENCOUNTER — Telehealth: Payer: Self-pay | Admitting: Neurology

## 2023-10-12 NOTE — Telephone Encounter (Signed)
Patient called and has a couple of questions regarding a medication. He states he discussed with provider and he suggest to stop taking medication. Please call patient

## 2023-10-12 NOTE — Telephone Encounter (Signed)
Called and left message to call office, 

## 2023-10-13 ENCOUNTER — Telehealth: Payer: Self-pay | Admitting: Neurology

## 2023-10-13 DIAGNOSIS — M6289 Other specified disorders of muscle: Secondary | ICD-10-CM

## 2023-10-13 MED ORDER — CYCLOBENZAPRINE HCL 10 MG PO TABS
10.0000 mg | ORAL_TABLET | Freq: Three times a day (TID) | ORAL | 3 refills | Status: DC | PRN
Start: 2023-10-13 — End: 2024-01-22

## 2023-10-13 NOTE — Telephone Encounter (Signed)
Called patient to discuss he recent questions about why we stopped gabapentin and if this should be used for numbness and stiffness on his left side since the stroke.  I explained to patient that gabapentin is used for neuropathic pain, specifically tingling and burning pain. He does not have this. That is why it was stopped.  There are no medications for numbness.  In terms of muscle stiffness, this is likely secondary to stroke. I did not note increased tone on the left, but given his stroke, this is the most likely etiology of his symptoms. He has been on flexeril in the past and tolerated it. I will start with this and if this does not help, patient will let me know.  All questions were answered.  Jacquelyne Balint, MD [facility]

## 2023-10-18 NOTE — Progress Notes (Deleted)
Cardiology Office Note   Date:  10/18/2023   ID:  Randy Ashley, Randy Ashley [birth year], MRN [id number]  PCP:  Gareth Morgan, MD  Cardiologist:   Dietrich Pates, MD   Pt presents for following HTN     History of Present Illness: Randy Ashley is a 67 y.o. male with a history of HTN, CVA, alcohol abuse    He was admitted on 09/08/22 with L sided numbness.    BP was severely elevated       The pt had an MRI that showed multiple punctate lesions   Echo done with bubble study     LVEF normal   Bubble study negative       I saw the pt in clinic in Oct 2023   30 day monitor showed normal SR   NO afib     I saw the pt in Feb 2024 The pt says he was doing OK then   But since he has developed intermitt L sided numbness/tightness in legs and arms and abdomen.   Not in bed ut when he is up and around   Started over the last month to get worse   Not working  now  He has been seen in ER  On 05/14/23 he ws seen in ER for chst tightness  Note says it occurred 2 hours after waking   EKG done  Note says it showed LBBB   I do not see that on EKG   The pt says now that he did not have CP   More abdominal discomfort   Breathing is OK   No CP   No dizziness    Biggest complaint is numbness on L    I saw the pt in July 2024   HE was seen by Rosette Reveal since   No outpatient medications have been marked as taking for the 10/21/23 encounter (Appointment) with Pricilla Riffle, MD.     Allergies:   Patient has no known allergies.   Past Medical History:  Diagnosis Date   Chronic back pain    CVA (cerebral vascular accident) [facility])    September 2023   Hypertension    Lumbar radiculopathy     Past Surgical History:  Procedure Laterality Date   HERNIA REPAIR       Social History:  The patient  reports that he has been smoking cigarettes. He has never used smokeless tobacco. He reports that he does not currently use alcohol. He reports that he does not use drugs.   Family History:  The patient's family  history includes Arthritis in an other family member; Diabetes in an other family member; Heart disease in his father, mother, and another family member.    ROS:  Please see the history of present illness. All other systems are reviewed and  Negative to the above problem except as noted.    PHYSICAL EXAM: VS:  There were no vitals taken for this visit.  GEN: Well nourished, well developed, in no acute distress  HEENT: normal  Neck: no JVD, no carotid bruit  R neck with ? Sebacous cyst/epidremoid cyst.     Back:   Soft tissue masses upper back (central/right)   on smaller nodule (? Sebacous cyst)   One larger, softer    Cardiac: RRR; no murmur  No LE edema  Respiratory:  clear to auscultation  GI: soft, nontender, nondistended, No hepatomegaly  MS: no deformity Moving all extremities   Neuro:  CN II to  XII intact   Moving all extremites      EKG:  EKG is not  ordered today.   Lipid Panel    Component Value Date/Time   CHOL 133 09/08/2022 1327   TRIG NOT PERFORMED 06/09/2023 1009   HDL NOT PERFORMED 06/09/2023 1009   CHOLHDL 5.8 09/08/2022 1327   VLDL 31 09/08/2022 1327   LDLCALC 79 09/08/2022 1327      Wt Readings from Last 3 Encounters:  09/10/23 157 lb (71.2 kg)  07/23/23 154 lb (69.9 kg)  06/09/23 147 lb (66.7 kg)      ASSESSMENT AND PLAN:  1  Neuro  CVA in 2023  Felt to be embolic Bubble study neg  Monitor did not show arrhythmia Did not have LINQ done  will reivew with EP  2  L sided numbness/ stiffness.   Not sure what this represents    He says he can't work  Feels it tighten up    He went through [employer], contracting  Says he felt better  Now worse  Will contact   3   HTN  BP is good on current regimen  4  Masses   Masses on back may be sebacous or epidermoid cysts     He does not have a primary now   WIll refer to derm  5  HL   LDL 79   Work on Diet   Keep on Lipitor  6   Metabolics   A1C 5.8  Cut back carbs/sweets  Cut out SSB  F/U in  fall  Current medicines  are reviewed at length with the patient today.  The patient does not have concerns regarding medicines.  Signed, Dietrich Pates, MD  10/18/2023 10:13 PM    [facility] [address] [place], [place], [place]  [address] Phone: [phone]; Fax: [phone]  ++

## 2023-10-21 ENCOUNTER — Ambulatory Visit: Payer: Medicare HMO | Attending: Internal Medicine | Admitting: Internal Medicine

## 2023-10-21 ENCOUNTER — Encounter: Payer: Self-pay | Admitting: Internal Medicine

## 2024-01-20 ENCOUNTER — Other Ambulatory Visit: Payer: Self-pay | Admitting: Neurology

## 2024-01-20 DIAGNOSIS — M6289 Other specified disorders of muscle: Secondary | ICD-10-CM

## 2024-01-21 ENCOUNTER — Telehealth: Payer: Self-pay | Admitting: Neurology

## 2024-01-21 NOTE — Telephone Encounter (Signed)
Pt made an appt with Dr.hill to get a refill on his medication

## 2024-01-21 NOTE — Telephone Encounter (Signed)
Called pt and left message that he needs an appointment for [place] to refill medicine. Call Office.

## 2024-01-22 ENCOUNTER — Other Ambulatory Visit: Payer: Self-pay

## 2024-01-22 DIAGNOSIS — M6289 Other specified disorders of muscle: Secondary | ICD-10-CM

## 2024-01-22 MED ORDER — CYCLOBENZAPRINE HCL 10 MG PO TABS
10.0000 mg | ORAL_TABLET | Freq: Three times a day (TID) | ORAL | 0 refills | Status: DC | PRN
Start: 2024-01-22 — End: 2024-01-22

## 2024-01-22 MED ORDER — CYCLOBENZAPRINE HCL 10 MG PO TABS
10.0000 mg | ORAL_TABLET | Freq: Three times a day (TID) | ORAL | 0 refills | Status: DC | PRN
Start: 2024-01-22 — End: 2024-02-26

## 2024-01-22 NOTE — Telephone Encounter (Signed)
New refill sent

## 2024-02-17 ENCOUNTER — Encounter: Payer: Self-pay | Admitting: *Deleted

## 2024-02-18 NOTE — Progress Notes (Signed)
 NEUROLOGY FOLLOW UP OFFICE NOTE  Randy Ashley [id number]  Subjective:  Randy Ashley is a 68 y.o. year old left-handed male with a medical history of stroke, current smoker, EtOH abuse, HTN, HLD, GERD, lumbar radiculopathy who we last saw on 09/10/23 for stroke.  To briefly review: Patient got up one morning prior to work (09/05/22) and did not feel great. He had been drinking EtOH the night before and thought that was the reason. He continued to not feel well after his shower, so he decided to go to the hospital. Patient mentioned numbness on the left side. This continues to current time. His BP on arrival was 214/83. NIHSS was 1 for diminished sensation on left compared to right. MRI brain showed multiple infarcts (right pons, right thalamus, right frontoparietal white matter, right posterior frontal white matter, and anterior right subinsular white matter). There were also chronic infarcts in right corona radiata/basal ganglia and bilateral deep gray nuclei. MRA showed no large vessel occlusion but intracranial atherosclerotic disease, with moderate to severe stenosis in bilateral PCA at P2/P3 junctions. Patient was given DAPT (asa and plavix) for 21 days and atorvastatin 40 mg daily. Echo was unremarkable with no shunt seen on agitated saline. 30 day holter showed no significant arrhythmias. Cardiology has discussed loop recorder with patient, but he is still considering this.    Patient continues to have symptoms, and has presented to ED multiple times. Most recent MRI brain on 05/21/23 showed no acute infarct. MRA on 05/21/23 showed no significant stenosis or large vessel occlusion.   Patient went to PT. He continues to do exercises at home.   Patient continues to smoke about 10-15 cigarettes.   EtOH use: none since stroke   Patient is currently on asa 81 mg, atorvastatin 40 mg for secondary stroke prevention. He is also on thiamine 100 mg daily. He takes gabapentin 300 mg TID, but he  denies any tingling or burning pain.  Most recent Assessment and Plan (09/10/23): Randy Ashley is a 68 y.o. male who presents for evaluation of left sided numbness since stroke in 08/2022. He has a relevant medical history of stroke, current smoker, EtOH abuse, HTN, HLD, GERD, lumbar radiculopathy. His neurological examination is pertinent for diminished sensation on left arm and subjective numbness in left leg. Available diagnostic data is significant for MRI brain showing multiple prior infarcts in deep white matter and brainstem (right side more than left). MRA in 2023 showed intracranial atherosclerosis. Patient's symptoms of left sided numbness are the residuals of his stroke. We discussed reducing future stroke risk by better controlling his risk factors, namely HTN, HLD, and smoking.      PLAN: -Continue aspirin 81 mg daily. -Continue atorvastatin 40 mg daily. Patient endorses not taking this recently. -Discussed importance of BP control. Patient will continue to work with PCP on this. -Discussed smoking cessation. Patient to think about this and reach out to PCP on options to help him quit. -Discussed gabapentin with patient. Given he is not having pain, he likely does not need it. He will stop this medication and go back on it if he has tingling or burning. -Discussed stroke warning signs  Since their last visit: Patient called on 10/12/23. Per my telephone note from 10/13/23:  Called patient to discuss he recent questions about why we stopped gabapentin and if this should be used for numbness and stiffness on his left side since the stroke.   I explained to patient that gabapentin is used for neuropathic pain,  specifically tingling and burning pain. He does not have this. That is why it was stopped.   There are no medications for numbness.   In terms of muscle stiffness, this is likely secondary to stroke. I did not note increased tone on the left, but given his stroke, this is the  most likely etiology of his symptoms. He has been on flexeril in the past and tolerated it. I will start with this and if this does not help, patient will let me know.  He continues to have left side numbness and stiffness of left leg. He did not see benefit from flexeril.   He continues to take asa 81 mg daily, lipitor 40 mg daily. He continues to smoke daily, 1 pack a day.  MEDICATIONS:  Outpatient Encounter Medications as of 02/26/2024  Medication Sig   amLODipine (NORVASC) 10 MG tablet Take 1 tablet (10 mg total) by mouth daily.   ASPIRIN LOW DOSE 81 MG tablet Take 81 mg by mouth daily.   atorvastatin (LIPITOR) 40 MG tablet Take 1 tablet (40 mg total) by mouth every evening.   folic acid (FOLVITE) 1 MG tablet Take 1 tablet (1 mg total) by mouth daily.   Multiple Vitamin (MULTIVITAMIN WITH MINERALS) TABS tablet Take 1 tablet by mouth daily.   thiamine (VITAMIN B1) 100 MG tablet Take 100 mg by mouth daily.   cyclobenzaprine (FLEXERIL) 10 MG tablet Take 1 tablet (10 mg total) by mouth 3 (three) times daily as needed for muscle spasms. (Patient not taking: Reported on 02/26/2024)   losartan (COZAAR) 100 MG tablet Take 1 tablet (100 mg total) by mouth daily.   No facility-administered encounter medications on file as of 02/26/2024.    PAST MEDICAL HISTORY: Past Medical History:  Diagnosis Date   Chronic back pain    CVA (cerebral vascular accident) (HCC)    September 2023   Hypertension    Lumbar radiculopathy     PAST SURGICAL HISTORY: Past Surgical History:  Procedure Laterality Date   HERNIA REPAIR      ALLERGIES: No Known Allergies  FAMILY HISTORY: Family History  Problem Relation Age of Onset   Heart disease Mother    Heart disease Father    Diabetes Other        Family History    Heart disease Other        Family History    Arthritis Other        Family History     SOCIAL HISTORY: Social History   Tobacco Use   Smoking status: Every Day    Current  packs/day: 1.00    Types: Cigarettes   Smokeless tobacco: Never   Tobacco comments:    1/2 pack a day 09/10/23  Vaping Use   Vaping status: Never Used  Substance Use Topics   Alcohol use: Not Currently    Comment: Drinks 6 pack of beer per day    Drug use: No   Social History   Social History Narrative   Are you right handed or left handed? left   Are you currently employed ? no   What is your current occupation? retired   Do you live at home alone?yes   Who lives with you?    What type of home do you live in: 1 story or 2 story? one   Caffeine daily      Objective:  Vital Signs:  BP (!) 147/77   Pulse 95   Wt 160 lb (72.6 kg)  SpO2 98%   BMI 25.06 kg/m   General: No acute distress.  Patient appears well-groomed.   Head:  Normocephalic/atraumatic Neck: supple Heart: regular rate and rhythm Lungs: Clear to auscultation bilaterally. Vascular: No carotid bruits.  Neurological Exam: Mental status: alert and oriented, speech fluent and not dysarthric, language intact.  Cranial nerves: CN I: not tested CN II: pupils equal, round and reactive to light, visual fields intact CN III, IV, VI:  full range of motion, no nystagmus, no ptosis CN V: facial sensation intact. CN VII: upper and lower face symmetric CN VIII: hearing intact CN IX, X: uvula midline CN XI: sternocleidomastoid and trapezius muscles intact CN XII: tongue midline  Bulk & Tone:  ?Mildly increased in LUE and LLE compared to right side Motor:  muscle strength 5/5 throughout Deep Tendon Reflexes:  2+ throughout,  toes downgoing.   Sensation:  Pinprick diminished in LUE and LLE compared to right. Finger to nose testing:  Without dysmetria.   Gait:  Normal station and stride.    Labs and Imaging review: No new results  Previously reviewed results: Lab Results  Component Value Date    HGBA1C 5.8 (H) 09/06/2022      Recent Labs  No results found for: "VITAMINB12"   Recent Labs       Lab  Results  Component Value Date    TSH 0.672 09/06/2022      Recent Labs[] Expand by Default       Lab Results  Component Value Date    ESRSEDRATE 2 09/11/2015      CMP (06/09/23) significant for mildly elevated glucose, AST, and ALT   Lipid panel (09/08/22): Component     Latest Ref Rng 09/08/2022  Cholesterol     0 - 200 mg/dL 409   Triglycerides     <150 mg/dL 811 (H)   HDL Cholesterol     >40 mg/dL 23 (L)   Total CHOL/HDL Ratio     RATIO 5.8   VLDL     0 - 40 mg/dL 31   LDL (calc)     0 - 99 mg/dL 79      MRI brain wo contrast and MRA head (09/08/22): I personally reviewed images and agree with radiology read below. IMPRESSION: MRI brain:   1. 7 mm acute infarct within the dorsal right pons. 2. 4 mm acute infarct within the right thalamus. 3. 4 mm acute/early subacute infarct within the right frontoparietal periventricular white matter. 4. 6 mm acute/early subacute infarct within the posterior right frontal lobe white matter. 5. 4 mm acute/early subacute infarct within the anterior right subinsular white matter 6. Given that the above described infarcts involve multiple vascular territories, consider an embolic process. 7. Background chronic small vessel ischemic disease with multiple chronic small-vessel infarcts, as described.   MRA head:   1. No intracranial large vessel occlusion is identified. 2. Intracranial atherosclerotic disease. Most notably, there are apparent moderate/severe stenoses within the bilateral posterior cerebral arteries at the P2/P3 junctions.   CTA head and neck (09/09/22): IMPRESSION: CT head:   1. The acute/early subacute infarcts within the dorsal right pons, right thalamus and right cerebral white matter demonstrated on yesterday's brain MRI are occult by CT. 2. No CT evidence of interval acute intracranial abnormality. 3. Redemonstrated chronic small-vessel infarct within the right corona radiata/basal ganglia. 4. Background  mild cerebral white matter chronic small vessel ischemic disease.   CTA neck:   1. The common carotid and internal carotid arteries are  patent within the neck without stenosis. Mild atherosclerotic plaque, bilaterally. 2. The vertebral arteries are patent within the neck without stenosis or significant atherosclerotic disease. 3. Aortic Atherosclerosis (ICD10-I70.0).   CTA head:   1. No intracranial large vessel occlusion or proximal high-grade arterial stenosis identified. 2. Non-stenotic atherosclerotic plaque within the intracranial ICAs, bilaterally.   MRI brain and MRA head wo contrast (05/21/23): I personally reviewed images and agree with radiology read below. IMPRESSION: 1. No acute intracranial process. 2. No intracranial large vessel occlusion or significant stenosis.   Echocardiogram (09/08/22): 1. Left ventricular ejection fraction, by estimation, is 60 to 65%. The  left ventricle has normal function. The left ventricle has no regional  wall motion abnormalities. Left ventricular diastolic parameters were  normal.   2. Right ventricular systolic function is normal. The right ventricular  size is normal. Tricuspid regurgitation signal is inadequate for assessing  PA pressure.   3. The mitral valve is normal in structure. No evidence of mitral valve  regurgitation. No evidence of mitral stenosis.   4. The aortic valve is tricuspid. Aortic valve regurgitation is not  visualized. No aortic stenosis is present.   5. The inferior vena cava is normal in size with greater than 50%  respiratory variability, suggesting right atrial pressure of 3 mmHg.   6. Agitated saline contrast bubble study was negative, with no evidence  of any interatrial shunt.  LA normal size.   30 day heart monitor (09/30/22):   30 day monitor, data available from 82% of planned monitored time   Min HR 45, Max HR 151, Avg HR 67   Reported symptoms correlated with normal sinus rhythm   No  significant arrhythmias  Assessment/Plan:  This is Randy Ashley, a 68 y.o. male with stroke in 08/2022 with residual left sided numbness and stiffness of LUE and LLE. MRI brain showed multiple prior infarcts in deep white matter and brainstem (right side more than left). MRA in 2023 showed intracranial atherosclerosis. Patient's stroke risk factors including current smoking, HTN, and HLD. The stiffness is presumed to be sequela of stroke. He did not respond to flexeril 10 mg TID.     Plan: -Will trial baclofen 5 mg TID PRN for muscle stiffness -Stop flexeril -Continue aspirin 81 mg daily -Continue atorvastatin 40 mg daily -Discussed smoking cessation -Stroke precautions discussed  Return to clinic in 6 months   Jacquelyne Balint, MD

## 2024-02-26 ENCOUNTER — Ambulatory Visit (INDEPENDENT_AMBULATORY_CARE_PROVIDER_SITE_OTHER): Payer: Medicare HMO | Admitting: Neurology

## 2024-02-26 ENCOUNTER — Encounter: Payer: Self-pay | Admitting: Neurology

## 2024-02-26 VITALS — BP 147/77 | HR 95 | Wt 160.0 lb

## 2024-02-26 DIAGNOSIS — M6289 Other specified disorders of muscle: Secondary | ICD-10-CM

## 2024-02-26 DIAGNOSIS — F17200 Nicotine dependence, unspecified, uncomplicated: Secondary | ICD-10-CM | POA: Diagnosis not present

## 2024-02-26 DIAGNOSIS — I6381 Other cerebral infarction due to occlusion or stenosis of small artery: Secondary | ICD-10-CM | POA: Diagnosis not present

## 2024-02-26 DIAGNOSIS — R200 Anesthesia of skin: Secondary | ICD-10-CM | POA: Diagnosis not present

## 2024-02-26 DIAGNOSIS — I10 Essential (primary) hypertension: Secondary | ICD-10-CM

## 2024-02-26 DIAGNOSIS — E785 Hyperlipidemia, unspecified: Secondary | ICD-10-CM

## 2024-02-26 MED ORDER — BACLOFEN 5 MG PO TABS: 5.0000 mg | ORAL_TABLET | Freq: Three times a day (TID) | ORAL | 2 refills | Status: DC | PRN

## 2024-02-26 NOTE — Patient Instructions (Signed)
 For your muscle stiffness, stop the flexeril since it did not help. We will try a medication called Baclofen. You will take 5 mg up to 3 times daily as needed. This medication can make you sleepy, so when you first start taking it, do not operate heavy machinery or a car until you know how it affects you.  Let me know if this is not helping.  Continue aspirin 81 mg daily.  Continue atorvastatin 40 mg daily.  Stopping smoking would help reduce your risk of another stroke or heart attack or cancer.  If you have new difficulty speaking, face droop, numbness on one side of the body, weakness on one side of the body, or dizziness/imbalance, this could be the sign of a stroke. Don't wait, please call EMS and be evaluated at the nearest emergency room.   I will see you again in 6 months. Please let me know if you have any questions or concerns in the meantime.   The physicians and staff at [facility] are committed to providing excellent care. You may receive a survey requesting feedback about your experience at our office. We strive to receive "very good" responses to the survey questions. If you feel that your experience would prevent you from giving the office a "very good " response, please contact our office to try to remedy the situation. We may be reached at [phone]. Thank you for taking the time out of your busy day to complete the survey.  Jacquelyne Balint, MD [facility]

## 2024-03-07 ENCOUNTER — Telehealth: Payer: Self-pay | Admitting: Neurology

## 2024-03-07 NOTE — Telephone Encounter (Signed)
 Pt called in stating the new medicine Dr. Loleta Chance prescribed isn't working. It's not as good as the first medicine.

## 2024-03-07 NOTE — Telephone Encounter (Signed)
 Left message on machine for patient to call back.

## 2024-03-08 ENCOUNTER — Other Ambulatory Visit: Payer: Self-pay

## 2024-03-08 DIAGNOSIS — M6289 Other specified disorders of muscle: Secondary | ICD-10-CM

## 2024-03-08 NOTE — Telephone Encounter (Signed)
 Left message on machine for patient to call back.

## 2024-03-08 NOTE — Telephone Encounter (Signed)
 Called and pt does not want to go on the medicine again. He would like the referral.

## 2024-05-09 ENCOUNTER — Telehealth: Payer: Self-pay | Admitting: Neurology

## 2024-05-09 ENCOUNTER — Other Ambulatory Visit: Payer: Self-pay | Admitting: Neurology

## 2024-05-09 DIAGNOSIS — I6381 Other cerebral infarction due to occlusion or stenosis of small artery: Secondary | ICD-10-CM

## 2024-05-09 DIAGNOSIS — M6289 Other specified disorders of muscle: Secondary | ICD-10-CM

## 2024-05-09 MED ORDER — CYCLOBENZAPRINE HCL 10 MG PO TABS: 10.0000 mg | ORAL_TABLET | Freq: Three times a day (TID) | ORAL | 3 refills | Status: DC | PRN

## 2024-05-09 NOTE — Telephone Encounter (Signed)
 1. Which medications need refilled? (List name and dosage, if known) cyclobenzaprine  instead of baclofen  - the cyclobenzaprine  works better than the baclofen   2. Which pharmacy/location is medication to be sent to? 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

## 2024-06-21 ENCOUNTER — Other Ambulatory Visit: Payer: Self-pay

## 2024-06-21 ENCOUNTER — Emergency Department (HOSPITAL_COMMUNITY)
Admission: EM | Admit: 2024-06-21 | Discharge: 2024-06-22 | Disposition: A | Discharge status: Home / Self Care | Attending: Emergency Medicine | Admitting: Emergency Medicine

## 2024-06-21 DIAGNOSIS — Z7982 Long term (current) use of aspirin: Secondary | ICD-10-CM | POA: Diagnosis not present

## 2024-06-21 DIAGNOSIS — I10 Essential (primary) hypertension: Secondary | ICD-10-CM | POA: Insufficient documentation

## 2024-06-21 DIAGNOSIS — R202 Paresthesia of skin: Secondary | ICD-10-CM | POA: Diagnosis not present

## 2024-06-21 DIAGNOSIS — Z79899 Other long term (current) drug therapy: Secondary | ICD-10-CM | POA: Insufficient documentation

## 2024-06-21 DIAGNOSIS — Z8673 Personal history of transient ischemic attack (TIA), and cerebral infarction without residual deficits: Secondary | ICD-10-CM | POA: Insufficient documentation

## 2024-06-21 DIAGNOSIS — R200 Anesthesia of skin: Secondary | ICD-10-CM | POA: Diagnosis present

## 2024-06-21 NOTE — ED Triage Notes (Addendum)
 Pt c/o numbness to left side of body. Reports he had a stroke a few years ago and has had some numbness since but feels that the numbness has significantly increased in the last hour. Denies any other s/s. A&Ox4

## 2024-06-22 ENCOUNTER — Emergency Department (HOSPITAL_COMMUNITY)

## 2024-06-22 LAB — APTT: aPTT: 31 s (ref 24–36)

## 2024-06-22 LAB — CBC
HCT: 38.8 % — ABNORMAL LOW (ref 39.0–52.0)
Hemoglobin: 13.2 g/dL (ref 13.0–17.0)
MCH: 30.8 pg (ref 26.0–34.0)
MCHC: 34 g/dL (ref 30.0–36.0)
MCV: 90.7 fL (ref 80.0–100.0)
Platelets: 230 K/uL (ref 150–400)
RBC: 4.28 MIL/uL (ref 4.22–5.81)
RDW: 14.3 % (ref 11.5–15.5)
WBC: 11.3 K/uL — ABNORMAL HIGH (ref 4.0–10.5)
nRBC: 0 % (ref 0.0–0.2)

## 2024-06-22 LAB — DIFFERENTIAL
Abs Immature Granulocytes: 0.03 K/uL (ref 0.00–0.07)
Basophils Absolute: 0.1 K/uL (ref 0.0–0.1)
Basophils Relative: 0 %
Eosinophils Absolute: 0.3 K/uL (ref 0.0–0.5)
Eosinophils Relative: 3 %
Immature Granulocytes: 0 %
Lymphocytes Relative: 34 %
Lymphs Abs: 3.9 K/uL (ref 0.7–4.0)
Monocytes Absolute: 0.9 K/uL (ref 0.1–1.0)
Monocytes Relative: 8 %
Neutro Abs: 6.1 K/uL (ref 1.7–7.7)
Neutrophils Relative %: 55 %

## 2024-06-22 LAB — COMPREHENSIVE METABOLIC PANEL WITH GFR
ALT: 65 U/L — ABNORMAL HIGH (ref 0–44)
AST: 41 U/L (ref 15–41)
Albumin: 3.5 g/dL (ref 3.5–5.0)
Alkaline Phosphatase: 91 U/L (ref 38–126)
Anion gap: 10 (ref 5–15)
BUN: 19 mg/dL (ref 8–23)
CO2: 22 mmol/L (ref 22–32)
Calcium: 8.7 mg/dL — ABNORMAL LOW (ref 8.9–10.3)
Chloride: 105 mmol/L (ref 98–111)
Creatinine, Ser: 1.35 mg/dL — ABNORMAL HIGH (ref 0.61–1.24)
GFR, Estimated: 58 mL/min — ABNORMAL LOW (ref >60)
Glucose, Bld: 121 mg/dL — ABNORMAL HIGH (ref 70–99)
Potassium: 4.3 mmol/L (ref 3.5–5.1)
Sodium: 137 mmol/L (ref 135–145)
Total Bilirubin: 0.4 mg/dL (ref 0.0–1.2)
Total Protein: 7.5 g/dL (ref 6.5–8.1)

## 2024-06-22 LAB — PROTIME-INR
INR: 1.1 (ref 0.8–1.2)
Prothrombin Time: 14.7 s (ref 11.4–15.2)

## 2024-06-22 LAB — ETHANOL: Alcohol, Ethyl (B): <15 mg/dL (ref <15)

## 2024-06-22 NOTE — Discharge Instructions (Signed)
 You were seen today for worsening paresthesia and tightness in the left side of your body.  Your imaging and lab results are largely reassuring.  While this does not fully rule out any stroke, you did not want to stay for MRI.  If you have new or worsening symptoms, you should be reevaluated immediately.  Continue your aspirin .

## 2024-06-22 NOTE — ED Notes (Signed)
 Patient transported to CT

## 2024-06-22 NOTE — ED Provider Notes (Signed)
 [place] EMERGENCY DEPARTMENT AT [facility] Provider Note   CSN: [id number] Arrival date & time: 06/21/24  2316     Patient presents with: Numbness   Randy Ashley is a 68 y.o. male.   HPI     This is a 68 year old male who presents with left-sided numbness and tightness.  Patient reports that he had a stroke 2 years ago.  He has experienced intermittent symptoms on the left side including numbness since that time.  He is prescribed Flexeril  for the symptoms.  He states approximately 1 hour ago he had onset of symptoms again but they seem more severe.  He describes them mostly as a tightness in his arm and his leg.  No weakness.  No speech difficulty.  No other strokelike symptoms.  He takes an aspirin  daily.  Prior to Admission medications   Medication Sig Start Date End Date Taking? Authorizing Provider  amLODipine  (NORVASC ) 10 MG tablet Take 1 tablet (10 mg total) by mouth daily. 09/08/22   Johnson, Clanford L, MD  ASPIRIN  LOW DOSE 81 MG tablet Take 81 mg by mouth daily. 05/02/23   [provider]  atorvastatin  (LIPITOR) 40 MG tablet Take 1 tablet (40 mg total) by mouth every evening. 09/08/22   Johnson, Clanford L, MD  cyclobenzaprine  (FLEXERIL ) 10 MG tablet Take 1 tablet (10 mg total) by mouth 3 (three) times daily as needed (for muscle stiffness). 05/09/24   Leigh Venetia CROME, MD  folic acid  (FOLVITE ) 1 MG tablet Take 1 tablet (1 mg total) by mouth daily. 09/09/22   Johnson, Clanford L, MD  losartan  (COZAAR ) 100 MG tablet Take 1 tablet (100 mg total) by mouth daily. 01/19/23 01/14/24  Okey Vina GAILS, MD  Multiple Vitamin (MULTIVITAMIN WITH MINERALS) TABS tablet Take 1 tablet by mouth daily. 09/09/22   Johnson, Clanford L, MD  thiamine  (VITAMIN B1) 100 MG tablet Take 100 mg by mouth daily. 05/19/23   [provider]    Allergies: Patient has no known allergies.    Review of Systems  Constitutional:  Negative for fever.  Respiratory:  Negative for shortness  of breath.   Cardiovascular:  Negative for chest pain.  Gastrointestinal:  Negative for abdominal pain.  Neurological:  Positive for numbness. Negative for dizziness, weakness and headaches.  All other systems reviewed and are negative.   Updated Vital Signs BP 125/74   Pulse 70   Temp 97.9 F (36.6 C)   Resp 18   Wt 69.5 kg   SpO2 99%   BMI 24.01 kg/m   Physical Exam Vitals and nursing note reviewed.  Constitutional:      Appearance: He is well-developed. He is not ill-appearing.  HENT:     Head: Normocephalic and atraumatic.  Eyes:     Pupils: Pupils are equal, round, and reactive to light.  Cardiovascular:     Rate and Rhythm: Normal rate and regular rhythm.     Heart sounds: Normal heart sounds. No murmur heard. Pulmonary:     Effort: Pulmonary effort is normal. No respiratory distress.     Breath sounds: Normal breath sounds. No wheezing.  Abdominal:     Palpations: Abdomen is soft.     Tenderness: There is no abdominal tenderness. There is no rebound.  Musculoskeletal:     Cervical back: Neck supple.  Lymphadenopathy:     Cervical: No cervical adenopathy.  Skin:    General: Skin is warm and dry.  Neurological:     Mental Status: He  is alert and oriented to person, place, and time.     Comments: Cranial nerves II through XII intact, 5 out of 5 strength in all 4 extremities, equal patellar reflexes bilaterally, sensation grossly intact     (all labs ordered are listed, but only abnormal results are displayed) Labs Reviewed  CBC - Abnormal; Notable for the following components:      Result Value   WBC 11.3 (*)    HCT 38.8 (*)    All other components within normal limits  COMPREHENSIVE METABOLIC PANEL WITH GFR - Abnormal; Notable for the following components:   Glucose, Bld 121 (*)    Creatinine, Ser 1.35 (*)    Calcium  8.7 (*)    ALT 65 (*)    GFR, Estimated 58 (*)    All other components within normal limits  ETHANOL  PROTIME-INR  APTT  DIFFERENTIAL   RAPID URINE DRUG SCREEN, HOSP PERFORMED  I-STAT CHEM 8, ED    EKG: EKG Interpretation Date/Time:  Tuesday June 21 2024 23:30:04 EDT Ventricular Rate:  84 PR Interval:  192 QRS Duration:  93 QT Interval:  369 QTC Calculation: 437 R Axis:   38  Text Interpretation: Sinus rhythm Low voltage, extremity leads Probable anteroseptal infarct, old Confirmed by Bari Pfeiffer ([id number]) on 06/22/2024 1:01:50 AM  Radiology: CT HEAD WO CONTRAST ( ) Result Date: 06/22/2024 CLINICAL DATA:  Neuro deficit, acute, stroke suspected EXAM: CT HEAD WITHOUT CONTRAST TECHNIQUE: Contiguous axial images were obtained from the base of the skull through the vertex without intravenous contrast. RADIATION DOSE REDUCTION: This exam was performed according to the departmental dose-optimization program which includes automated exposure control, adjustment of the mA and/or kV according to patient size and/or use of iterative reconstruction technique. COMPARISON:  MRI head May 21, 2023.  CT head May 01, 2023. FINDINGS: Brain: No evidence of acute infarction, hemorrhage, hydrocephalus, extra-axial collection or mass lesion/mass effect. Vascular: Calcific atherosclerosis. No hyperdense vessel identified. Skull: No acute fracture. Sinuses/Orbits: Clear sinuses.  No acute orbital findings. Other: No mastoid effusions. IMPRESSION: No evidence of acute intracranial abnormality. Electronically Signed   By: Gilmore GORMAN Molt M.D.   On: 06/22/2024 00:23     Procedures   Medications Ordered in the ED - No data to display  Clinical Course as of 06/22/24 0225  Wed Jun 22, 2024  0213 Discussed with patient workup.  Offered MRI in the morning to fully rule out new stroke.  Patient does not wish to stay.  He understands the risk and benefits.  He states he feels better. [CH]    Clinical Course User Index [CH] Moncia Annas, Pfeiffer FALCON, MD                                 Medical Decision Making Amount and/or Complexity of Data  Reviewed Labs: ordered. Radiology: ordered.   This patient presents to the ED for concern of left-sided tingling and tightness, this involves an extensive number of treatment options, and is a complaint that carries with it a high risk of complications and morbidity.  I considered the following differential and admission for this acute, potentially life threatening condition.  The differential diagnosis includes chronic symptoms related to old CVA, new CVA, metabolic derangement  MDM:    This is a 68 year old male who presents with acute on chronic left paresthesia and tightness.  He is nontoxic and vital signs are reassuring.  MRI reviewed from 2 years  ago with multiple strokes including lacunar strokes.  He is on aspirin .  Neurologic exam today is objectively benign.  CT head is normal.  Stroke labs are reassuring.  Patient has had the symptoms intermittently since his stroke but states it feels worse.  Recommended the patient stay for MRI in the morning to rule out new stroke.  Patient declines.  States he feels better.  Recommend that he continue aspirin  and follow-up with neurology as an outpatient.  (Labs, imaging, consults)  Labs: I Ordered, and personally interpreted labs.  The pertinent results include: Stroke lab work  Imaging Studies ordered: I ordered imaging studies including CT head I independently visualized and interpreted imaging. I agree with the radiologist interpretation  Additional history obtained from chart review.  External records from outside source obtained and reviewed including prior evaluations  Cardiac Monitoring: The patient was maintained on a cardiac monitor.  If on the cardiac monitor, I personally viewed and interpreted the cardiac monitored which showed an underlying rhythm of: Sinus  Reevaluation: After the interventions noted above, I reevaluated the patient and found that they have :improved  Social Determinants of Health:  lives  independently  Disposition: Discharge  Co morbidities that complicate the patient evaluation  Past Medical History:  Diagnosis Date   Chronic back pain    CVA (cerebral vascular accident) [facility])    September 2023   Hypertension    Lumbar radiculopathy      Medicines No orders of the defined types were placed in this encounter.   I have reviewed the patients home medicines and have made adjustments as needed  Problem List / ED Course: Problem List Items Addressed This Visit   None Visit Diagnoses       Paresthesia    -  Primary                Final diagnoses:  Paresthesia    ED Discharge Orders     None          Bari Charmaine FALCON, MD 06/22/24 [phone]

## 2024-07-29 ENCOUNTER — Emergency Department (HOSPITAL_COMMUNITY)
Admission: EM | Admit: 2024-07-29 | Discharge: 2024-07-29 | Disposition: A | Discharge status: Home / Self Care | Attending: Emergency Medicine | Admitting: Emergency Medicine

## 2024-07-29 ENCOUNTER — Encounter (HOSPITAL_COMMUNITY): Payer: Self-pay

## 2024-07-29 ENCOUNTER — Other Ambulatory Visit: Payer: Self-pay

## 2024-07-29 DIAGNOSIS — M25622 Stiffness of left elbow, not elsewhere classified: Secondary | ICD-10-CM

## 2024-07-29 DIAGNOSIS — M25612 Stiffness of left shoulder, not elsewhere classified: Secondary | ICD-10-CM | POA: Insufficient documentation

## 2024-07-29 DIAGNOSIS — I10 Essential (primary) hypertension: Secondary | ICD-10-CM | POA: Diagnosis not present

## 2024-07-29 DIAGNOSIS — F17210 Nicotine dependence, cigarettes, uncomplicated: Secondary | ICD-10-CM | POA: Diagnosis not present

## 2024-07-29 NOTE — Discharge Instructions (Signed)
You were evaluated in the Emergency Department and after careful evaluation, we did not find any emergent condition requiring admission or further testing in the hospital.  Your exam/testing today is overall reassuring.  Recommend follow-up with your primary care doctor to discuss your symptoms.  Please return to the Emergency Department if you experience any worsening of your condition.   Thank you for allowing [place] to be a part of your care.

## 2024-07-29 NOTE — ED Triage Notes (Signed)
 Pov from home. Cc of his chronic left arm and leg stiffness. Says it was little worse this morning to he took a flexeril  right before he came in. Says its 5/10.

## 2024-07-29 NOTE — ED Provider Notes (Signed)
 [facility]-EMERGENCY DEPT [facility] Emergency Department Provider Note MRN:  [id number]  Arrival date & time: 07/29/24     Chief Complaint   Chronic Stiffness   History of Present Illness   Randy Ashley is a 68 y.o. year-old male with a history of stroke presenting to the ED with chief complaint of stiffness.  Patient is chronic stiffness, slight sensory deficit on the left from prior stroke.  Woke up this morning and his left shoulder/left upper arm seemed a bit more stiff than normal.  Here for evaluation out of abundance of caution.  Took a Flexeril  upon arrival to the emergency department.  Denies any new numbness or weakness to the arms or legs, no trouble with speech or vision, no headache.  Review of Systems  A thorough review of systems was obtained and all systems are negative except as noted in the HPI and PMH.   Patient's Health History    Past Medical History:  Diagnosis Date   Chronic back pain    CVA (cerebral vascular accident) [facility])    September 2023   Hypertension    Lumbar radiculopathy     Past Surgical History:  Procedure Laterality Date   HERNIA REPAIR      Family History  Problem Relation Age of Onset   Heart disease Mother    Heart disease Father    Diabetes Other        Family History    Heart disease Other        Family History    Arthritis Other        Family History     Social History   Socioeconomic History   Marital status: Divorced    Spouse name: Not on file   Number of children: Not on file   Years of education: 12   Highest education level: Not on file  Occupational History   Occupation: dye and Ecologist   Tobacco Use   Smoking status: Every Day    Current packs/day: 1.00    Types: Cigarettes   Smokeless tobacco: Never   Tobacco comments:    1/2 pack a day 09/10/23  Vaping Use   Vaping status: Never Used  Substance and Sexual Activity   Alcohol use: Not Currently    Comment: Drinks 6 pack of beer per day     Drug use: No   Sexual activity: Not on file  Other Topics Concern   Not on file  Social History Narrative   Are you right handed or left handed? left   Are you currently employed ? no   What is your current occupation? retired   Do you live at home alone?yes   Who lives with you?    What type of home do you live in: 1 story or 2 story? one   Caffeine daily   Social Drivers of Corporate investment banker Strain: Not on file  Food Insecurity: No Food Insecurity (09/06/2022)   Hunger Vital Sign    Worried About Running Out of Food in the Last Year: Never true    Ran Out of Food in the Last Year: Never true  Transportation Needs: No Transportation Needs (09/06/2022)   PRAPARE - Administrator, Civil Service (Medical): No    Lack of Transportation (Non-Medical): No  Physical Activity: Not on file  Stress: Not on file  Social Connections: Not on file  Intimate Partner Violence: Not At Risk (09/06/2022)   Humiliation, Afraid, Rape, and  Kick questionnaire    Fear of Current or Ex-Partner: No    Emotionally Abused: No    Physically Abused: No    Sexually Abused: No     Physical Exam   Vitals:   07/29/24 0425  BP: 135/73  Pulse: 92  Resp: 16  Temp: 97.8 F (36.6 C)  SpO2: 97%    CONSTITUTIONAL: Well-appearing, NAD NEURO/PSYCH:  Alert and oriented x 3, normal and symmetric strength and sensation, normal coordination, normal speech EYES:  eyes equal and reactive ENT/NECK:  no LAD, no JVD CARDIO: Regular rate, well-perfused, normal S1 and S2 PULM:  CTAB no wheezing or rhonchi GI/GU:  non-distended, non-tender MSK/SPINE:  No gross deformities, no edema SKIN:  no rash, atraumatic   *Additional and/or pertinent findings included in MDM below  Diagnostic and Interventional Summary    EKG Interpretation Date/Time:    Ventricular Rate:    PR Interval:    QRS Duration:    QT Interval:    QTC Calculation:   R Axis:      Text Interpretation:         Labs  Reviewed - No data to display  No orders to display    Medications - No data to display   Procedures  /  Critical Care Procedures  ED Course and Medical Decision Making  Initial Impression and Ddx Patient does not have neurological deficits at this time that are new, his description sounds more like a muscular stiffness to the upper extremity.  He has had similar presentations in the past with thorough evaluation including MRI that was negative.  Highly doubt acute stroke at this time.  Past medical/surgical history that increases complexity of ED encounter: Stroke  Interpretation of Diagnostics Laboratory and/or imaging options to aid in the diagnosis/care of the patient were considered.  After careful history and physical examination, it was determined that there was no indication for diagnostics at this time.  Patient Reassessment and Ultimate Disposition/Management     With time after the Flexeril  I reassessed the patient and he said he felt a lot better, stiffness resolved.  Appropriate for discharge with return precautions.  Patient management required discussion with the following services or consulting groups:  None  Complexity of Problems Addressed Acute illness or injury that poses threat of life of bodily function  Additional Data Reviewed and Analyzed Further history obtained from: None  Additional Factors Impacting ED Encounter Risk None  Ozell HERO. Theadore, MD [facility] Emergency Medicine [facility] [email or web address]  Final Clinical Impressions(s) / ED Diagnoses     ICD-10-CM   1. Stiffness of left upper arm joint  M25.622       ED Discharge Orders     None        Discharge Instructions Discussed with and Provided to Patient:    Discharge Instructions      You were evaluated in the Emergency Department and after careful evaluation, we did not find any emergent condition requiring admission or further testing in the  hospital.  Your exam/testing today is overall reassuring.  Recommend follow-up with your primary care doctor to discuss your symptoms.  Please return to the Emergency Department if you experience any worsening of your condition.   Thank you for allowing us  to be a part of your care.      Theadore Ozell HERO, MD 07/29/24 [phone]

## 2024-07-29 NOTE — ED Notes (Signed)
 ED Provider at bedside.

## 2024-08-25 NOTE — Progress Notes (Signed)
 NEUROLOGY FOLLOW UP OFFICE NOTE  Randy Ashley [id number]  Subjective:  Randy Ashley is a 68 y.o. year old left-handed male with a medical history of stroke, current smoker, EtOH abuse, HTN, HLD, GERD, lumbar radiculopathy who we last saw on 02/26/24 for stroke.  To briefly review: 09/10/23: Patient got up one morning prior to work (09/05/22) and did not feel great. He had been drinking EtOH the night before and thought that was the reason. He continued to not feel well after his shower, so he decided to go to the hospital. Patient mentioned numbness on the left side. This continues to current time. His BP on arrival was 214/83. NIHSS was 1 for diminished sensation on left compared to right. MRI brain showed multiple infarcts (right pons, right thalamus, right frontoparietal white matter, right posterior frontal white matter, and anterior right subinsular white matter). There were also chronic infarcts in right corona radiata/basal ganglia and bilateral deep gray nuclei. MRA showed no large vessel occlusion but intracranial atherosclerotic disease, with moderate to severe stenosis in bilateral PCA at P2/P3 junctions. Patient was given DAPT (asa and plavix ) for 21 days and atorvastatin  40 mg daily. Echo was unremarkable with no shunt seen on agitated saline. 30 day holter showed no significant arrhythmias. Cardiology has discussed loop recorder with patient, but he is still considering this.    Patient continues to have symptoms, and has presented to ED multiple times. Most recent MRI brain on 05/21/23 showed no acute infarct. MRA on 05/21/23 showed no significant stenosis or large vessel occlusion.   Patient went to PT. He continues to do exercises at home.   Patient continues to smoke about 10-15 cigarettes.   EtOH use: none since stroke   Patient is currently on asa 81 mg, atorvastatin  40 mg for secondary stroke prevention. He is also on thiamine  100 mg daily. He takes gabapentin 300 mg TID,  but he denies any tingling or burning pain.  02/26/24: Patient called on 10/12/23. Per my telephone note from 10/13/23:   Called patient to discuss he recent questions about why we stopped gabapentin and if this should be used for numbness and stiffness on his left side since the stroke.   I explained to patient that gabapentin is used for neuropathic pain, specifically tingling and burning pain. He does not have this. That is why it was stopped.   There are no medications for numbness.   In terms of muscle stiffness, this is likely secondary to stroke. I did not note increased tone on the left, but given his stroke, this is the most likely etiology of his symptoms. He has been on flexeril  in the past and tolerated it. I will start with this and if this does not help, patient will let me know.   He continues to have left side numbness and stiffness of left leg. He did not see benefit from flexeril .    He continues to take asa 81 mg daily, lipitor 40 mg daily. He continues to smoke daily, 1 pack a day.  Most recent Assessment and Plan (02/26/24): This is Randy Ashley, a 68 y.o. male with stroke in 08/2022 with residual left sided numbness and stiffness of LUE and LLE. MRI brain showed multiple prior infarcts in deep white matter and brainstem (right side more than left). MRA in 2023 showed intracranial atherosclerosis. Patient's stroke risk factors including current smoking, HTN, and HLD. The stiffness is presumed to be sequela of stroke. He did not respond to flexeril  10  mg TID.      Plan: -Will trial baclofen  5 mg TID PRN for muscle stiffness -Stop flexeril  -Continue aspirin  81 mg daily -Continue atorvastatin  40 mg daily -Discussed smoking cessation -Stroke precautions discussed  Since their last visit: Patient did not feel medication was working for stiffness, so referral for botox was given on 03/08/24. He is afraid of needles so he has not done this or not sure he wants to.   He is  taking flexeril  10 mg three times a day. He does think this helps.  He presented to ED for symptoms on 06/21/24 and 07/29/24. He knows the ED does not help, so he does not plan to go back for this non-urgent problem anymore.  He is endorsing a lot of fatigue with even mild activity. He denies any issues with sleep and feels he sleeps well. He denies any weight loss. He thinks his nutrition is good.  Patient continues to smoke daily.  MEDICATIONS:  Outpatient Encounter Medications as of 09/02/2024  Medication Sig   amLODipine  (NORVASC ) 10 MG tablet Take 1 tablet (10 mg total) by mouth daily.   ASPIRIN  LOW DOSE 81 MG tablet Take 81 mg by mouth daily.   atorvastatin  (LIPITOR) 40 MG tablet Take 1 tablet (40 mg total) by mouth every evening.   cyclobenzaprine  (FLEXERIL ) 10 MG tablet Take 1 tablet (10 mg total) by mouth 3 (three) times daily as needed (for muscle stiffness).   folic acid  (FOLVITE ) 1 MG tablet Take 1 tablet (1 mg total) by mouth daily.   losartan  (COZAAR ) 100 MG tablet Take 1 tablet (100 mg total) by mouth daily.   Multiple Vitamin (MULTIVITAMIN WITH MINERALS) TABS tablet Take 1 tablet by mouth daily.   thiamine  (VITAMIN B1) 100 MG tablet Take 100 mg by mouth daily.   No facility-administered encounter medications on file as of 09/02/2024.    PAST MEDICAL HISTORY: Past Medical History:  Diagnosis Date   Chronic back pain    CVA (cerebral vascular accident) (HCC)    September 2023   Hypertension    Lumbar radiculopathy     PAST SURGICAL HISTORY: Past Surgical History:  Procedure Laterality Date   HERNIA REPAIR      ALLERGIES: No Known Allergies  FAMILY HISTORY: Family History  Problem Relation Age of Onset   Heart disease Mother    Heart disease Father    Diabetes Other        Family History    Heart disease Other        Family History    Arthritis Other        Family History     SOCIAL HISTORY: Social History   Tobacco Use   Smoking status: Every Day     Current packs/day: 1.00    Types: Cigarettes   Smokeless tobacco: Never   Tobacco comments:    1/2 pack a day 09/10/23///09/03/24- 1/2 pack a day  Vaping Use   Vaping status: Never Used  Substance Use Topics   Alcohol use: Not Currently    Comment: Drinks 6 pack of beer per day    Drug use: No   Social History   Social History Narrative   Are you right handed or left handed? left   Are you currently employed ? no   What is your current occupation? retired   Do you live at home alone?yes   Who lives with you?    What type of home do you live in: 1 story or  2 story? one   Caffeine daily      Objective:  Vital Signs:  BP 107/69   Pulse 90   Ht 5' 7 (1.702 m)   Wt 152 lb (68.9 kg)   SpO2 99%   BMI 23.81 kg/m   General: No acute distress.  Patient appears well-groomed.   Head:  Normocephalic/atraumatic Neck: supple Heart:  Regular rate and rhythm Lungs:  Clear to auscultation bilaterally Neurological Exam: alert and oriented.  Speech fluent and not dysarthric, language intact.  CN II-XII intact. Bulk and tone normal (no significant left sided stiffness), muscle strength 5/5 throughout.  Sensation to pinprick intact, including on the left arm and leg.  Deep tendon reflexes 2+ throughout.  Finger to nose testing intact.  Gait normal.   Labs and Imaging review: New results: 06/22/24: CMP significant for glucose 121, Cr 1.35, ALT 65 CBC unremarkable  CT head wo contrast (06/22/24): FINDINGS: Brain: No evidence of acute infarction, hemorrhage, hydrocephalus, extra-axial collection or mass lesion/mass effect.   Vascular: Calcific atherosclerosis. No hyperdense vessel identified.   Skull: No acute fracture.   Sinuses/Orbits: Clear sinuses.  No acute orbital findings.   Other: No mastoid effusions.   IMPRESSION: No evidence of acute intracranial abnormality.  Previously reviewed results: Lab Results  Component Value Date    HGBA1C 5.8 (H) 09/06/2022       Recent Labs  No results found for: VITAMINB12    Recent Labs           Lab Results  Component Value Date    TSH 0.672 09/06/2022      Recent Labs[] Expand by Default           Lab Results  Component Value Date    ESRSEDRATE 2 09/11/2015      CMP (06/09/23) significant for mildly elevated glucose, AST, and ALT   Lipid panel (09/08/22): Component     Latest Ref Rng 09/08/2022  Cholesterol     0 - 200 mg/dL 866   Triglycerides     <150 mg/dL 842 (H)   HDL Cholesterol     >40 mg/dL 23 (L)   Total CHOL/HDL Ratio     RATIO 5.8   VLDL     0 - 40 mg/dL 31   LDL (calc)     0 - 99 mg/dL 79       MRI brain wo contrast and MRA head (09/08/22): I personally reviewed images and agree with radiology read below. IMPRESSION: MRI brain:   1. 7 mm acute infarct within the dorsal right pons. 2. 4 mm acute infarct within the right thalamus. 3. 4 mm acute/early subacute infarct within the right frontoparietal periventricular white matter. 4. 6 mm acute/early subacute infarct within the posterior right frontal lobe white matter. 5. 4 mm acute/early subacute infarct within the anterior right subinsular white matter 6. Given that the above described infarcts involve multiple vascular territories, consider an embolic process. 7. Background chronic small vessel ischemic disease with multiple chronic small-vessel infarcts, as described.   MRA head:   1. No intracranial large vessel occlusion is identified. 2. Intracranial atherosclerotic disease. Most notably, there are apparent moderate/severe stenoses within the bilateral posterior cerebral arteries at the P2/P3 junctions.   CTA head and neck (09/09/22): IMPRESSION: CT head:   1. The acute/early subacute infarcts within the dorsal right pons, right thalamus and right cerebral white matter demonstrated on yesterday's brain MRI are occult by CT. 2. No CT evidence of interval acute intracranial abnormality.  3. Redemonstrated  chronic small-vessel infarct within the right corona radiata/basal ganglia. 4. Background mild cerebral white matter chronic small vessel ischemic disease.   CTA neck:   1. The common carotid and internal carotid arteries are patent within the neck without stenosis. Mild atherosclerotic plaque, bilaterally. 2. The vertebral arteries are patent within the neck without stenosis or significant atherosclerotic disease. 3. Aortic Atherosclerosis (ICD10-I70.0).   CTA head:   1. No intracranial large vessel occlusion or proximal high-grade arterial stenosis identified. 2. Non-stenotic atherosclerotic plaque within the intracranial ICAs, bilaterally.   MRI brain and MRA head wo contrast (05/21/23): I personally reviewed images and agree with radiology read below. IMPRESSION: 1. No acute intracranial process. 2. No intracranial large vessel occlusion or significant stenosis.   Echocardiogram (09/08/22): 1. Left ventricular ejection fraction, by estimation, is 60 to 65%. The  left ventricle has normal function. The left ventricle has no regional  wall motion abnormalities. Left ventricular diastolic parameters were  normal.   2. Right ventricular systolic function is normal. The right ventricular  size is normal. Tricuspid regurgitation signal is inadequate for assessing  PA pressure.   3. The mitral valve is normal in structure. No evidence of mitral valve  regurgitation. No evidence of mitral stenosis.   4. The aortic valve is tricuspid. Aortic valve regurgitation is not  visualized. No aortic stenosis is present.   5. The inferior vena cava is normal in size with greater than 50%  respiratory variability, suggesting right atrial pressure of 3 mmHg.   6. Agitated saline contrast bubble study was negative, with no evidence  of any interatrial shunt.  LA normal size.   30 day heart monitor (09/30/22):   30 day monitor, data available from 82% of planned monitored time   Min HR 45,  Max HR 151, Avg HR 67   Reported symptoms correlated with normal sinus rhythm   No significant arrhythmias  Assessment/Plan:  This is Randy Ashley, a 68 y.o. male with stroke in 08/2022 with residual left sided numbness and stiffness of LUE and LLE. MRI brain showed multiple prior infarcts in deep white matter and brainstem (right side more than left). MRA in 2023 showed intracranial atherosclerosis. Patient's stroke risk factors including current smoking, HTN, and HLD. The stiffness is presumed to be sequela of stroke. He has tried both baclofen  and flexeril  for stiffness, liking flexeril  more. Today, he also describes daily fatigue. I will check labs to look for treatable causes.   Plan: -Blood work: B1, B12, vit D, TSH, lipid panel -Continue flexeril  10 mg TID for stiffness -Continue aspirin  81 mg daily -Smoking cessation discussed -Stroke precautions discussed  Return to clinic in 1 year   Venetia Potters, MD

## 2024-09-02 ENCOUNTER — Ambulatory Visit (INDEPENDENT_AMBULATORY_CARE_PROVIDER_SITE_OTHER): Admitting: Neurology

## 2024-09-02 ENCOUNTER — Encounter: Payer: Self-pay | Admitting: Neurology

## 2024-09-02 ENCOUNTER — Other Ambulatory Visit

## 2024-09-02 VITALS — BP 107/69 | HR 90 | Ht 67.0 in | Wt 152.0 lb

## 2024-09-02 DIAGNOSIS — I6381 Other cerebral infarction due to occlusion or stenosis of small artery: Secondary | ICD-10-CM

## 2024-09-02 DIAGNOSIS — M6289 Other specified disorders of muscle: Secondary | ICD-10-CM | POA: Diagnosis not present

## 2024-09-02 DIAGNOSIS — F17200 Nicotine dependence, unspecified, uncomplicated: Secondary | ICD-10-CM | POA: Diagnosis not present

## 2024-09-02 DIAGNOSIS — R5383 Other fatigue: Secondary | ICD-10-CM | POA: Diagnosis not present

## 2024-09-02 NOTE — Patient Instructions (Signed)
-  Blood work today -Continue flexeril  10 mg three times daily for stiffness -Continue aspirin  81 mg daily  You should stop smoking to prevent health problems like strokes.  If you have new difficulty speaking, face droop, numbness on one side of the body, weakness on one side of the body, or dizziness/imbalance, this could be the sign of a stroke. Don't wait, please call EMS and be evaluated at the nearest emergency room.  The physicians and staff at [facility] are committed to providing excellent care. You may receive a survey requesting feedback about your experience at our office. We strive to receive very good responses to the survey questions. If you feel that your experience would prevent you from giving the office a very good  response, please contact our office to try to remedy the situation. We may be reached at [phone]. Thank you for taking the time out of your busy day to complete the survey.  Venetia Potters, MD [facility]

## 2024-09-05 ENCOUNTER — Other Ambulatory Visit: Payer: Self-pay | Admitting: Neurology

## 2024-09-05 DIAGNOSIS — M6289 Other specified disorders of muscle: Secondary | ICD-10-CM

## 2024-09-07 LAB — LIPID PANEL
Cholesterol: 96 mg/dL (ref <200)
HDL: 22 mg/dL — ABNORMAL LOW (ref >=40)
LDL Cholesterol (Calc): 50 mg/dL
Non-HDL Cholesterol (Calc): 74 mg/dL (ref <130)
Total CHOL/HDL Ratio: 4.4 (calc) (ref <5.0)
Triglycerides: 161 mg/dL — ABNORMAL HIGH (ref <150)

## 2024-09-07 LAB — VITAMIN B1: Vitamin B1 (Thiamine): 6 nmol/L — ABNORMAL LOW (ref 8–30)

## 2024-09-07 LAB — VITAMIN D 25 HYDROXY (VIT D DEFICIENCY, FRACTURES): Vit D, 25-Hydroxy: 10 ng/mL — ABNORMAL LOW (ref 30–100)

## 2024-09-07 LAB — VITAMIN B12: Vitamin B-12: 597 pg/mL (ref 200–1100)

## 2024-09-07 LAB — TSH: TSH: 1.66 m[IU]/L (ref 0.40–4.50)

## 2024-09-08 ENCOUNTER — Ambulatory Visit: Payer: Self-pay | Admitting: Neurology

## 2024-09-08 DIAGNOSIS — E519 Thiamine deficiency, unspecified: Secondary | ICD-10-CM

## 2024-09-08 DIAGNOSIS — E559 Vitamin D deficiency, unspecified: Secondary | ICD-10-CM

## 2024-09-08 MED ORDER — VITAMIN D (ERGOCALCIFEROL) 1.25 MG (50000 UNIT) PO CAPS: 50000.0000 [IU] | ORAL_CAPSULE | ORAL | 0 refills | Status: AC

## 2024-09-08 NOTE — Telephone Encounter (Signed)
 CALLED patient and inform him of [facility] results. He understood with no more questions.

## 2024-09-12 ENCOUNTER — Telehealth: Payer: Self-pay | Admitting: Neurology

## 2024-09-12 NOTE — Telephone Encounter (Signed)
 Called patient and left a message for a call back.      Vitamin recommendations from Dr. Leigh per Below:  B1 (also know as thiamine ) is low. I recommend supplementing with B1 (thiamine ) 100 mg every day. This one is very important because low thiamine  can cause irreversible damage if not treated. Vitamin D  was also low. I recommend supplementing with vitamin D3 50000 international units once a week for 8 weeks, followed by 1000 international units every day. I have prescribed the 50000 international units and sent to his pharmacy ([facility] in [place] on [address]).    B1 and the daily vitamin D  can be bought over the counter at any drug store or online. He should let us  know if he has problems getting either.

## 2024-09-12 NOTE — Telephone Encounter (Signed)
 Pt called in and stated that he would like to know the names  of the vitamins he can purchase  over the counter. The Dr. Did tell him , but he forgot. Thanks

## 2024-09-13 ENCOUNTER — Telehealth: Payer: Self-pay | Admitting: Neurology

## 2024-09-13 NOTE — Telephone Encounter (Signed)
 See previous encounter

## 2024-09-13 NOTE — Telephone Encounter (Signed)
 Called pt AND left a detailed message about the vitamins and told him to call and let me know that he received it and understood it.

## 2024-09-13 NOTE — Telephone Encounter (Signed)
 Howard called and has questions regarding vitamins to buy. Please call back.

## 2024-10-18 ENCOUNTER — Ambulatory Visit: Admitting: Gastroenterology

## 2024-10-18 ENCOUNTER — Encounter: Payer: Self-pay | Admitting: Gastroenterology

## 2024-10-18 VITALS — BP 120/76 | HR 84 | Temp 98.0°F | Ht 67.0 in | Wt 158.4 lb

## 2024-10-18 DIAGNOSIS — R7989 Other specified abnormal findings of blood chemistry: Secondary | ICD-10-CM | POA: Diagnosis not present

## 2024-10-18 DIAGNOSIS — R195 Other fecal abnormalities: Secondary | ICD-10-CM | POA: Insufficient documentation

## 2024-10-18 NOTE — Progress Notes (Signed)
 GI Office Note    Referring Provider: Nsumanganyi, [place] Ce* Primary Care Physician:  Nsumanganyi, [place] Cesar, NP  Primary Gastroenterologist: Carlin POUR. Cindie, DO   Chief Complaint   Chief Complaint  Patient presents with   Colonoscopy    Here due to positive cologuard     History of Present Illness   Randy Ashley is a 68 y.o. male presenting today for colonoscopy due to positive cologuard (08/2023). Also with chronic elevation of AST/ALT.  Patient here to consider colonoscopy. He has never had one. He is not looking forward to the bowel prep, having to drink a lot of fluid. He is scared of needles as well. He denies FH of colon cancer.   No bowel concerns. No blood in the stool. No melena. Denies prior history of heartburn, vomiting, dysphagia, abdominal pain, or unintentional weight loss. States he was put on all of these medications after his stroke in 2023. He is on lansoprazole  chronically.   He has chronically elevated AST/ALT back in 08/2022 his AST 182, ALT 173. More recently his AST/ALT are mildly elevated with AST 55, ALT 81. He was consuming 6 pack of beer daily in 2023 but quit after his stroke 08/2022.    Prior Data   08/2024: A1C 6.7, TSH 0.9, WBC 9.9, Hgb 14.6, Plt 271, Cre 1.46, alb 4.5, Tbili 0.3, AO 103, AST 55, ALT 81, Vit D 10, vit B1 6  08/2022: RUQ U/S normal.  Medications   Current Outpatient Medications  Medication Sig Dispense Refill   amLODipine  (NORVASC ) 10 MG tablet Take 1 tablet (10 mg total) by mouth daily. 30 tablet 1   ASPIRIN  LOW DOSE 81 MG tablet Take 81 mg by mouth daily.     atorvastatin  (LIPITOR) 40 MG tablet Take 1 tablet (40 mg total) by mouth every evening. 30 tablet 1   cyclobenzaprine  (FLEXERIL ) 10 MG tablet TAKE 1 TAB BY MOUTH 3 TIMES DAILY AS NEEDED (FOR MUSCLE STIFFNESS). 90 tablet 0   folic acid  (FOLVITE ) 1 MG tablet Take 1 tablet (1 mg total) by mouth daily. 30 tablet 1   losartan  (COZAAR ) 100 MG tablet Take 1  tablet (100 mg total) by mouth daily. 90 tablet 3   metFORMIN (GLUCOPHAGE) 500 MG tablet Take 500 mg by mouth daily with breakfast.     Multiple Vitamin (MULTIVITAMIN WITH MINERALS) TABS tablet Take 1 tablet by mouth daily. 30 tablet 1   thiamine  (VITAMIN B1) 100 MG tablet Take 100 mg by mouth daily.     Vitamin D , Ergocalciferol , (DRISDOL) 1.25 MG (50000 UNIT) CAPS capsule Take 1 capsule (50,000 Units total) by mouth every 7 (seven) days. 8 capsule 0   gabapentin (NEURONTIN) 300 MG capsule Take 300 mg by mouth 3 (three) times daily.     lansoprazole  (PREVACID ) 30 MG capsule Take 30 mg by mouth daily.     No current facility-administered medications for this visit.    Allergies   Allergies as of 10/18/2024   (No Known Allergies)    Past Medical History   Past Medical History:  Diagnosis Date   Chronic back pain    CVA (cerebral vascular accident) [facility])    September 2023   Hypertension    Lumbar radiculopathy     Past Surgical History   Past Surgical History:  Procedure Laterality Date   HERNIA REPAIR Left 1995   inguinal    Past Family History   Family History  Problem Relation Age of Onset  Heart disease Mother    Heart disease Father    Diabetes Other        Family History    Heart disease Other        Family History    Arthritis Other        Family History    Colon cancer Neg Hx     Past Social History   Social History   Socioeconomic History   Marital status: Divorced    Spouse name: Not on file   Number of children: Not on file   Years of education: 12   Highest education level: Not on file  Occupational History   Occupation: dye and ecologist   Tobacco Use   Smoking status: Every Day    Current packs/day: 1.00    Types: Cigarettes   Smokeless tobacco: Never   Tobacco comments:    1/2 pack a day 09/10/23///09/03/24- 1/2 pack a day  Vaping Use   Vaping status: Never Used  Substance and Sexual Activity   Alcohol use: Not Currently     Comment: Drinks 6 pack of beer per day    Drug use: No   Sexual activity: Not on file  Other Topics Concern   Not on file  Social History Narrative   Are you right handed or left handed? left   Are you currently employed ? no   What is your current occupation? retired   Do you live at home alone?yes   Who lives with you?    What type of home do you live in: 1 story or 2 story? one   Caffeine daily   Social Drivers of Corporate Investment Banker Strain: Not on file  Food Insecurity: No Food Insecurity (09/06/2022)   Hunger Vital Sign    Worried About Running Out of Food in the Last Year: Never true    Ran Out of Food in the Last Year: Never true  Transportation Needs: No Transportation Needs (09/06/2022)   PRAPARE - Administrator, Civil Service (Medical): No    Lack of Transportation (Non-Medical): No  Physical Activity: Not on file  Stress: Not on file  Social Connections: Not on file  Intimate Partner Violence: Not At Risk (09/06/2022)   Humiliation, Afraid, Rape, and Kick questionnaire    Fear of Current or Ex-Partner: No    Emotionally Abused: No    Physically Abused: No    Sexually Abused: No    Review of Systems   General: Negative for anorexia, weight loss, fever, chills, fatigue. Left sided weakness/spasticity since his cva. Eyes: Negative for vision changes. Cataract surgery planned in near future.  ENT: Negative for hoarseness, difficulty swallowing , nasal congestion. CV: Negative for chest pain, angina, palpitations, dyspnea on exertion, peripheral edema.  Respiratory: Negative for dyspnea at rest, dyspnea on exertion, cough, sputum, wheezing.  GI: See history of present illness. GU:  Negative for dysuria, hematuria, urinary incontinence, urinary frequency, nocturnal urination.  MS: Negative for joint pain, low back pain.  Derm: Negative for rash or itching.  Neuro: Negative for weakness, abnormal sensation, seizure, frequent headaches, memory loss,   confusion.  Psych: Negative for anxiety, depression, suicidal ideation, hallucinations.  Endo: Negative for unusual weight change.  Heme: Negative for bruising or bleeding. Allergy: Negative for rash or hives.  Physical Exam   BP 120/76 (BP Location: Right Arm, Patient Position: Sitting, Cuff Size: Normal)   Pulse 84   Temp 98 F (36.7 C) (Oral)  Ht 5' 7 (1.702 m)   Wt 158 lb 6.4 oz (71.8 kg)   SpO2 97%   BMI 24.81 kg/m    General: Well-nourished, well-developed in no acute distress.  Head: Normocephalic, atraumatic.   Eyes: Conjunctiva pink, no icterus. Mouth: Oropharyngeal mucosa moist and pink  Neck: Supple without thyromegaly, masses, or lymphadenopathy.  Lungs: Clear to auscultation bilaterally.  Heart: Regular rate and rhythm, no murmurs rubs or gallops.  Abdomen: Bowel sounds are normal, nontender, nondistended, no hepatosplenomegaly or masses,  no abdominal bruits or hernia, no rebound or guarding.   Rectal: not performed Extremities: No lower extremity edema. No clubbing or deformities.  Neuro: Alert and oriented x 4 , grossly normal neurologically.  Skin: Warm and dry, no rash or jaundice.   Psych: Alert and cooperative, normal mood and affect.  Labs   See above  Imaging Studies   No results found.  Assessment/Plan:    Positive  Cologuard (08/2023): -no prior colonoscopy -no bowel concerns -patient reluctant for colonoscopy but agreeable with information regarding small volume prep -he wants to postpone until his cataract surgery -colonoscopy with Dr. Cindie with small volume prep appropriate for mild CKD. ASA 3 Rm 1,2 ok.  I have discussed the risks, alternatives, benefits with regards to but not limited to the risk of reaction to medication, bleeding, infection, perforation and the patient is agreeable to proceed. Written consent to be obtained. -patient will call when he is ready to schedule. Phone number provided -we will reach out to him in 12/2024  if we have not heard from him  Elevated AST/ALT: -chronic for at least 2 years -ruq u/s normal in 2023 -history of heavy etoh use but quit in 08/2022 -may be secondary to medications, need to consider other etiologies -LFTs, CBC, Hep B/C serologies, autoimmune serologies, iron/tibc/ferritin     Sonny RAMAN. Ezzard, MHS, PA-C [facility]

## 2024-10-18 NOTE — Patient Instructions (Signed)
 Please complete labs at your convenience.   When you are ready to schedule colonoscopy please call us  at  [phone].

## 2024-10-21 LAB — CBC WITH DIFFERENTIAL/PLATELET
Absolute Lymphocytes: 4284 {cells}/uL — ABNORMAL HIGH (ref 850–3900)
Absolute Monocytes: 818 {cells}/uL (ref 200–950)
Basophils Absolute: 55 {cells}/uL (ref 0–200)
Basophils Relative: 0.5 %
Eosinophils Absolute: 327 {cells}/uL (ref 15–500)
Eosinophils Relative: 3 %
HCT: 43.4 % (ref 38.5–50.0)
Hemoglobin: 14.6 g/dL (ref 13.2–17.1)
MCH: 29.9 pg (ref 27.0–33.0)
MCHC: 33.6 g/dL (ref 32.0–36.0)
MCV: 88.9 fL (ref 80.0–100.0)
MPV: 10.3 fL (ref 7.5–12.5)
Monocytes Relative: 7.5 %
Neutro Abs: 5417 {cells}/uL (ref 1500–7800)
Neutrophils Relative %: 49.7 %
Platelets: 289 Thousand/uL (ref 140–400)
RBC: 4.88 Million/uL (ref 4.20–5.80)
RDW: 13.8 % (ref 11.0–15.0)
Total Lymphocyte: 39.3 %
WBC: 10.9 Thousand/uL — ABNORMAL HIGH (ref 3.8–10.8)

## 2024-10-21 LAB — MITOCHONDRIAL ANTIBODIES: Mitochondrial M2 Ab, IgG: <=20 U (ref <=20.0)

## 2024-10-21 LAB — ANA, IFA COMPREHENSIVE PANEL
Anti Nuclear Antibody (ANA): NEGATIVE
ENA SM Ab Ser-aCnc: <1 AI
SM/RNP: <1 AI
SSA (Ro) (ENA) Antibody, IgG: <1 AI
SSB (La) (ENA) Antibody, IgG: <1 AI
Scleroderma (Scl-70) (ENA) Antibody, IgG: <1 AI
ds DNA Ab: <1 [IU]/mL

## 2024-10-21 LAB — ANTI-SMOOTH MUSCLE ANTIBODY, IGG: Actin (Smooth Muscle) Antibody (IGG): 38 U — ABNORMAL HIGH (ref <20)

## 2024-10-21 LAB — HEPATIC FUNCTION PANEL
AG Ratio: 1.2 (calc) (ref 1.0–2.5)
ALT: 38 U/L (ref 9–46)
AST: 27 U/L (ref 10–35)
Albumin: 4.6 g/dL (ref 3.6–5.1)
Alkaline phosphatase (APISO): 85 U/L (ref 35–144)
Bilirubin, Direct: 0.1 mg/dL (ref 0.0–0.2)
Globulin: 3.9 g/dL — ABNORMAL HIGH (ref 1.9–3.7)
Indirect Bilirubin: 0.2 mg/dL (ref 0.2–1.2)
Total Bilirubin: 0.3 mg/dL (ref 0.2–1.2)
Total Protein: 8.5 g/dL — ABNORMAL HIGH (ref 6.1–8.1)

## 2024-10-21 LAB — HEPATITIS B SURFACE ANTIGEN: Hepatitis B Surface Ag: NONREACTIVE

## 2024-10-21 LAB — HEPATITIS C RNA QUANTITATIVE
HCV Quantitative Log: 6.49 {Log_IU}/mL — ABNORMAL HIGH
HCV RNA, PCR, QN: 3100000 [IU]/mL — ABNORMAL HIGH

## 2024-10-21 LAB — IRON,TIBC AND FERRITIN PANEL
%SAT: 17 % — ABNORMAL LOW (ref 20–48)
Ferritin: 160 ng/mL (ref 24–380)
Iron: 60 ug/dL (ref 50–180)
TIBC: 348 ug/dL (ref 250–425)

## 2024-10-21 LAB — IGG, IGA, IGM
IgG (Immunoglobin G), Serum: 1850 mg/dL — ABNORMAL HIGH (ref 600–1540)
IgM, Serum: 177 mg/dL (ref 50–300)
Immunoglobulin A: 193 mg/dL (ref 70–320)

## 2024-10-21 LAB — HEPATITIS C ANTIBODY: Hepatitis C Ab: REACTIVE — AB

## 2024-10-28 ENCOUNTER — Other Ambulatory Visit: Payer: Self-pay | Admitting: Neurology

## 2024-10-28 DIAGNOSIS — M6289 Other specified disorders of muscle: Secondary | ICD-10-CM

## 2024-10-29 ENCOUNTER — Ambulatory Visit: Payer: Self-pay | Admitting: Gastroenterology

## 2024-10-31 ENCOUNTER — Other Ambulatory Visit: Payer: Self-pay | Admitting: Neurology

## 2024-10-31 DIAGNOSIS — E559 Vitamin D deficiency, unspecified: Secondary | ICD-10-CM

## 2024-11-02 ENCOUNTER — Encounter: Payer: Self-pay | Admitting: *Deleted

## 2024-11-02 ENCOUNTER — Other Ambulatory Visit: Payer: Self-pay | Admitting: *Deleted

## 2024-11-02 MED ORDER — NA SULFATE-K SULFATE-MG SULF 17.5-3.13-1.6 GM/177ML PO SOLN: ORAL | 0 refills | Status: AC

## 2024-11-16 ENCOUNTER — Encounter: Payer: Self-pay | Admitting: Gastroenterology

## 2024-11-16 ENCOUNTER — Telehealth: Payer: Self-pay | Admitting: *Deleted

## 2024-11-16 NOTE — Telephone Encounter (Signed)
 Pt called to cancel his procedure due to not having anyone to go with him for his procedure. He says if he finds someone that can go with him then he will call back to reschedule. FYI

## 2024-11-16 NOTE — Telephone Encounter (Signed)
 noted

## 2024-11-16 NOTE — Telephone Encounter (Signed)
 Noted. He is on recall for ov 12/2024 to discuss hep c. We will follow up on need for colonoscopy at that time. His positive cologuard was from 08/2023.

## 2024-11-17 ENCOUNTER — Encounter (HOSPITAL_COMMUNITY): Admission: RE | Admit: 2024-11-17

## 2024-11-22 ENCOUNTER — Encounter (HOSPITAL_COMMUNITY): Admission: RE | Payer: Self-pay

## 2024-11-22 ENCOUNTER — Ambulatory Visit (HOSPITAL_COMMUNITY): Admission: RE | Admit: 2024-11-22 | Admitting: Internal Medicine

## 2024-11-22 SURGERY — COLONOSCOPY
Anesthesia: Choice

## 2024-11-29 ENCOUNTER — Other Ambulatory Visit: Payer: Self-pay

## 2024-11-29 ENCOUNTER — Telehealth: Payer: Self-pay | Admitting: Neurology

## 2024-11-29 DIAGNOSIS — M6289 Other specified disorders of muscle: Secondary | ICD-10-CM

## 2024-11-29 MED ORDER — CYCLOBENZAPRINE HCL 10 MG PO TABS: 10.0000 mg | ORAL_TABLET | Freq: Three times a day (TID) | ORAL | 0 refills | Status: DC | PRN

## 2024-11-29 NOTE — Telephone Encounter (Signed)
 Who's calling (name and relationship to patient) : Randy Ashley; Self   Best contact number: [phone]  Provider they see: Dr. Leigh   Reason for call: Pt called in to request a refill for Rx, he stated that he has about 3 days left.    Call ID:      PRESCRIPTION REFILL ONLY  Name of prescription: Cyclobenzaprine    Pharmacy: [facility]

## 2024-12-27 ENCOUNTER — Other Ambulatory Visit: Payer: Self-pay | Admitting: Neurology

## 2024-12-27 DIAGNOSIS — M6289 Other specified disorders of muscle: Secondary | ICD-10-CM

## 2025-09-07 ENCOUNTER — Ambulatory Visit: Admitting: Neurology
